# Patient Record
Sex: Female | Born: 1980 | Race: Black or African American | Hispanic: No | Marital: Married | Smoking: Never smoker
Health system: Southern US, Community
[De-identification: ages and names within clinical notes are randomized; demographics above are authoritative.]

## PROBLEM LIST (undated history)

## (undated) ENCOUNTER — Inpatient Hospital Stay (HOSPITAL_COMMUNITY): Payer: Self-pay

---

## 2014-04-26 ENCOUNTER — Inpatient Hospital Stay (HOSPITAL_COMMUNITY)
Admission: AD | Admit: 2014-04-26 | Discharge: 2014-04-26 | Disposition: A | Discharge status: Home / Self Care | Payer: Medicaid Other | Source: Ambulatory Visit | Attending: Obstetrics & Gynecology | Admitting: Obstetrics & Gynecology

## 2014-04-26 ENCOUNTER — Encounter (HOSPITAL_COMMUNITY): Payer: Self-pay | Admitting: Advanced Practice Midwife

## 2014-04-26 DIAGNOSIS — N910 Primary amenorrhea: Secondary | ICD-10-CM | POA: Insufficient documentation

## 2014-04-26 DIAGNOSIS — Z3201 Encounter for pregnancy test, result positive: Secondary | ICD-10-CM | POA: Diagnosis not present

## 2014-04-26 LAB — POCT PREGNANCY, URINE: PREG TEST UR: POSITIVE — AB

## 2014-04-26 NOTE — MAU Note (Addendum)
Here today to find out if she is preg.  Hx of irregular cycles, thinks her last was in Dec.  + HPT the end of Jan.  No pain, bleeding or d/c. occ itching

## 2014-04-26 NOTE — MAU Provider Note (Signed)
S: 34 y.o. G1P0 @[redacted]w[redacted]d  by unsure LMP presents to MAU for pregnancy confirmation.  She denies abdominal pain or vaginal bleeding today.  Pt declines language line for [place] interpreter, desires to use her s/o who is present today.  O: BP 131/75 mmHg  Pulse 83  Temp(Src) 97.6 F (36.4 C) (Oral)  Resp 18  Ht 5\' 3"  (1.6 m)  Wt 95.255 kg (210 lb)  BMI 37.21 kg/m2  LMP 01/17/2014 Physical Examination: General appearance - alert, well appearing, and in no distress, oriented to person, place, and time and acyanotic, in no respiratory distress  Results for orders placed or performed during the hospital encounter of 04/26/14 (from the past 48 hour(s))  Pregnancy, urine POC     Status: Abnormal   Collection Time: 04/26/14  1:48 PM  Result Value Ref Range   Preg Test, Ur POSITIVE (A) NEGATIVE    Comment:        THE SENSITIVITY OF THIS METHODOLOGY IS >24 mIU/mL    FHT likely present, but difficult by doppler, faintly and intermittently audible  A: Positive pregnancy test Late menses  P: D/C home Pregnancy verification letter given F/U with early prenatal care List of providers given Return to MAU as needed for emergencies  LEFTWICH-KIRBY, Delcia Spitzley, CNM 2:47 PM

## 2014-05-03 ENCOUNTER — Telehealth: Payer: Self-pay | Admitting: *Deleted

## 2014-05-03 NOTE — Telephone Encounter (Signed)
Patient is requesting a NOB appointment. Attempted to contact patient and left a message on voicemail for patient to contact the office.

## 2014-05-05 NOTE — Telephone Encounter (Signed)
Patient returned call 05-04-14 @ 4:51.  Attempted to return the patient's call and the number is no longer in service.

## 2014-05-25 ENCOUNTER — Other Ambulatory Visit (HOSPITAL_COMMUNITY): Payer: Self-pay | Admitting: Nurse Practitioner

## 2014-05-25 DIAGNOSIS — Z3689 Encounter for other specified antenatal screening: Secondary | ICD-10-CM

## 2014-05-25 LAB — OB RESULTS CONSOLE PLATELET COUNT: Platelets: 206 10*3/uL

## 2014-05-25 LAB — OB RESULTS CONSOLE ANTIBODY SCREEN: Antibody Screen: NEGATIVE

## 2014-05-25 LAB — CYTOLOGY - PAP: PAP SMEAR: NEGATIVE

## 2014-05-25 LAB — OB RESULTS CONSOLE RPR: RPR: NONREACTIVE

## 2014-05-25 LAB — SICKLE CELL SCREEN: Sickle Cell Screen: NORMAL

## 2014-05-25 LAB — OB RESULTS CONSOLE HGB/HCT, BLOOD
HCT: 36 %
Hemoglobin: 11.5 g/dL

## 2014-05-25 LAB — OB RESULTS CONSOLE ABO/RH: RH Type: POSITIVE

## 2014-05-25 LAB — OB RESULTS CONSOLE HEPATITIS B SURFACE ANTIGEN: HEP B S AG: NEGATIVE

## 2014-05-25 LAB — OB RESULTS CONSOLE VARICELLA ZOSTER ANTIBODY, IGG: VARICELLA IGG: IMMUNE

## 2014-05-25 LAB — GLUCOSE TOLERANCE, 1 HOUR (50G) W/O FASTING: GLUCOSE 1 HOUR GTT: 134 mg/dL (ref ?–200)

## 2014-05-25 LAB — CYSTIC FIBROSIS DIAGNOSTIC STUDY: INTERPRETATION-CFDNA: NEGATIVE

## 2014-05-25 LAB — OB RESULTS CONSOLE GC/CHLAMYDIA
CHLAMYDIA, DNA PROBE: NEGATIVE
Gonorrhea: NEGATIVE

## 2014-05-25 LAB — OB RESULTS CONSOLE RUBELLA ANTIBODY, IGM: Rubella: IMMUNE

## 2014-05-26 LAB — OB RESULTS CONSOLE HIV ANTIBODY (ROUTINE TESTING): HIV: NONREACTIVE

## 2014-05-31 ENCOUNTER — Other Ambulatory Visit (HOSPITAL_COMMUNITY): Payer: Self-pay | Admitting: Nurse Practitioner

## 2014-05-31 ENCOUNTER — Ambulatory Visit (HOSPITAL_COMMUNITY)
Admission: RE | Admit: 2014-05-31 | Discharge: 2014-05-31 | Disposition: A | Discharge status: Home / Self Care | Payer: Medicaid Other | Source: Ambulatory Visit | Attending: Nurse Practitioner | Admitting: Nurse Practitioner

## 2014-05-31 ENCOUNTER — Ambulatory Visit (HOSPITAL_COMMUNITY): Admission: RE | Admit: 2014-05-31 | Payer: Medicaid Other | Source: Ambulatory Visit

## 2014-05-31 DIAGNOSIS — Z3A26 26 weeks gestation of pregnancy: Secondary | ICD-10-CM | POA: Insufficient documentation

## 2014-05-31 DIAGNOSIS — O09529 Supervision of elderly multigravida, unspecified trimester: Secondary | ICD-10-CM | POA: Insufficient documentation

## 2014-05-31 DIAGNOSIS — O09522 Supervision of elderly multigravida, second trimester: Secondary | ICD-10-CM | POA: Diagnosis not present

## 2014-05-31 DIAGNOSIS — O09292 Supervision of pregnancy with other poor reproductive or obstetric history, second trimester: Secondary | ICD-10-CM | POA: Diagnosis not present

## 2014-05-31 DIAGNOSIS — Z315 Encounter for genetic counseling: Secondary | ICD-10-CM | POA: Insufficient documentation

## 2014-05-31 DIAGNOSIS — O3421 Maternal care for scar from previous cesarean delivery: Secondary | ICD-10-CM | POA: Diagnosis not present

## 2014-05-31 DIAGNOSIS — Z3689 Encounter for other specified antenatal screening: Secondary | ICD-10-CM

## 2014-05-31 NOTE — Progress Notes (Signed)
Appointment Date: 05/31/2014 DOB: 01/05/1981 Referring Provider: Alberteen Spindlearson, Ashley C, NP Attending: Dr. Eulis FosterKristen Porter  [name] and her husband, Lauren Porter, were seen for genetic counseling because of a maternal age of 34 at delivery.  [name] reports that she speaks [place], but understands [place].  Her husband acted as her interpreter, and she declined a medical interpreter.     They were counseled regarding maternal age and the association with risk for chromosome conditions due to nondisjunction.   We reviewed chromosomes, nondisjunction, and the associated 1 in 179 risk for fetal aneuploidy related to a maternal age of 34 at delivery.  They were counseled that the risk for aneuploidy decreases as gestational age increases, accounting for those pregnancies which spontaneously abort.  We specifically discussed Down syndrome (trisomy 2121), trisomies 5813 and 2318, and sex chromosome aneuploidies (47,XXX and 47,XXY) including the common features and prognoses of each.   We also reviewed the results of her Quad screen. They were counseled that screening tests are used to modify a patient's a priori risk for aneuploidy, typically based on age. We discussed that the Quad screen adjusts the age related chance for Down syndrome and Trisomy 5218.  Her Quad screen reduced the chance for each of these conditions in the pregnancy.  However, it also identified an elevated MSAFP of 2.79 MoM.  We discussed that this elevation of MSAFP is associated with a 1-2% chance for a fetal open neural tube defect.   We reviewed ONTDs, the typical multifactorial etiology, and variable prognosis.  In addition, we discussed additional explanations for an elevated MSAFP including: normal variation, twins, feto-maternal bleeding, a gestational dating error, abdominal wall defects, kidney differences, oligohydramnios, and placental problems.  We reviewed additional available screening options including noninvasive prenatal  screening (NIPS)/cell free DNA (cfDNA) testing and detailed ultrasound.    We reviewed the benefits and limitations of each option. Specifically, we discussed the conditions for which each test screens, the detection rates, and false positive rates of each. They were also counseled regarding diagnostic testing via amniocentesis. We reviewed the approximate 1 in 300-500 risk for complications for amniocentesis, including spontaneous pregnancy loss.   A complete ultrasound was performed today. The ultrasound report will be sent under separate cover. There were no visualized fetal anomalies or markers suggestive of aneuploidy.  Dating was changed, such that the Quad screen was drawn too late for interpretation.  This would explain the elevation of MSAFP, but it would also eliminate the normal screening results for Down syndrome and Trisomy 6018 and Lauren Porter would still have her age related chances.  Diagnostic testing was declined today.  They understand that ultrasound cannot rule out all birth defects or genetic syndromes. The patient was advised of this limitation and states she still does not want additional testing or screening at this time.   [name] was provided with written information regarding sickle cell anemia (SCA) including the carrier frequency and incidence in the African-American population, the availability of carrier testing and prenatal diagnosis if indicated.  In addition, we discussed that hemoglobinopathies are routinely screened for as part of the [place] newborn screening panel.  She declined hemoglobin electrophoresis today.  Both family histories were reviewed and found to be noncontributory for birth defects, intellectual disability, and known genetic conditions.  Their last pregnancy, a daughter, was an IUFD.  [name] and [place] there had sent her home to wait another week for the baby to turn,  as she was breech. This was at her due date. A week later  she returned to the doctors office, and while her baby was moving at that time, she was still breech.  She was sent home for another week and at some point during that week she noticed a lack of fetal movement.  At delivery, her daughter did not have any apparent anomalies.  Without further information regarding the provided family history, an accurate genetic risk cannot be calculated. Further genetic counseling is warranted if more information is obtained.  Mrs. Crutcher denied exposure to environmental toxins or chemical agents. She denied the use of alcohol, tobacco or street drugs. She denied significant viral illnesses during the course of her pregnancy. Her medical and surgical histories were noncontributory.   I counseled this couple regarding the above risks and available options.  The approximate face-to-face time with the genetic counselor was 45 minutes.  Mady Gemma, MS,  Certified Genetic Counselor

## 2014-06-08 ENCOUNTER — Encounter: Payer: Medicaid Other | Admitting: Obstetrics & Gynecology

## 2014-06-12 ENCOUNTER — Encounter: Payer: Self-pay | Admitting: *Deleted

## 2014-06-28 ENCOUNTER — Other Ambulatory Visit (HOSPITAL_COMMUNITY): Payer: Self-pay | Admitting: Maternal and Fetal Medicine

## 2014-06-28 ENCOUNTER — Ambulatory Visit (HOSPITAL_COMMUNITY)
Admission: RE | Admit: 2014-06-28 | Discharge: 2014-06-28 | Disposition: A | Discharge status: Home / Self Care | Payer: Medicaid Other | Source: Ambulatory Visit | Attending: Obstetrics and Gynecology | Admitting: Obstetrics and Gynecology

## 2014-06-28 ENCOUNTER — Encounter (HOSPITAL_COMMUNITY): Payer: Self-pay

## 2014-06-28 DIAGNOSIS — O3421 Maternal care for scar from previous cesarean delivery: Secondary | ICD-10-CM | POA: Insufficient documentation

## 2014-06-28 DIAGNOSIS — O09299 Supervision of pregnancy with other poor reproductive or obstetric history, unspecified trimester: Secondary | ICD-10-CM

## 2014-06-28 DIAGNOSIS — Z3A30 30 weeks gestation of pregnancy: Secondary | ICD-10-CM | POA: Diagnosis not present

## 2014-06-28 DIAGNOSIS — O09523 Supervision of elderly multigravida, third trimester: Secondary | ICD-10-CM | POA: Insufficient documentation

## 2014-06-28 DIAGNOSIS — O3413 Maternal care for benign tumor of corpus uteri, third trimester: Secondary | ICD-10-CM | POA: Insufficient documentation

## 2014-06-28 DIAGNOSIS — Z98891 History of uterine scar from previous surgery: Secondary | ICD-10-CM

## 2014-06-28 DIAGNOSIS — Z36 Encounter for antenatal screening of mother: Secondary | ICD-10-CM | POA: Insufficient documentation

## 2014-06-28 DIAGNOSIS — Z3689 Encounter for other specified antenatal screening: Secondary | ICD-10-CM

## 2014-06-28 DIAGNOSIS — O09293 Supervision of pregnancy with other poor reproductive or obstetric history, third trimester: Secondary | ICD-10-CM | POA: Insufficient documentation

## 2014-06-29 ENCOUNTER — Encounter: Payer: Self-pay | Admitting: Obstetrics and Gynecology

## 2014-06-29 ENCOUNTER — Ambulatory Visit (INDEPENDENT_AMBULATORY_CARE_PROVIDER_SITE_OTHER): Payer: Medicaid Other | Admitting: Obstetrics and Gynecology

## 2014-06-29 VITALS — BP 102/55 | HR 82 | Temp 97.5°F | Wt 221.3 lb

## 2014-06-29 DIAGNOSIS — O99213 Obesity complicating pregnancy, third trimester: Secondary | ICD-10-CM

## 2014-06-29 DIAGNOSIS — O0991 Supervision of high risk pregnancy, unspecified, first trimester: Secondary | ICD-10-CM | POA: Diagnosis not present

## 2014-06-29 DIAGNOSIS — O09299 Supervision of pregnancy with other poor reproductive or obstetric history, unspecified trimester: Secondary | ICD-10-CM | POA: Insufficient documentation

## 2014-06-29 DIAGNOSIS — O0933 Supervision of pregnancy with insufficient antenatal care, third trimester: Secondary | ICD-10-CM | POA: Diagnosis not present

## 2014-06-29 DIAGNOSIS — O3421 Maternal care for scar from previous cesarean delivery: Secondary | ICD-10-CM | POA: Diagnosis not present

## 2014-06-29 DIAGNOSIS — O09293 Supervision of pregnancy with other poor reproductive or obstetric history, third trimester: Secondary | ICD-10-CM

## 2014-06-29 DIAGNOSIS — O34219 Maternal care for unspecified type scar from previous cesarean delivery: Secondary | ICD-10-CM

## 2014-06-29 DIAGNOSIS — O09529 Supervision of elderly multigravida, unspecified trimester: Secondary | ICD-10-CM | POA: Insufficient documentation

## 2014-06-29 DIAGNOSIS — E669 Obesity, unspecified: Secondary | ICD-10-CM | POA: Diagnosis not present

## 2014-06-29 DIAGNOSIS — O99210 Obesity complicating pregnancy, unspecified trimester: Secondary | ICD-10-CM | POA: Insufficient documentation

## 2014-06-29 DIAGNOSIS — Z23 Encounter for immunization: Secondary | ICD-10-CM | POA: Diagnosis not present

## 2014-06-29 DIAGNOSIS — O09523 Supervision of elderly multigravida, third trimester: Secondary | ICD-10-CM

## 2014-06-29 DIAGNOSIS — O0990 Supervision of high risk pregnancy, unspecified, unspecified trimester: Secondary | ICD-10-CM | POA: Insufficient documentation

## 2014-06-29 LAB — POCT URINALYSIS DIP (DEVICE)
BILIRUBIN URINE: NEGATIVE
Glucose, UA: NEGATIVE mg/dL
Hgb urine dipstick: NEGATIVE
KETONES UR: NEGATIVE mg/dL
Leukocytes, UA: NEGATIVE
Nitrite: NEGATIVE
Protein, ur: NEGATIVE mg/dL
Specific Gravity, Urine: 1.015 (ref 1.005–1.030)
UROBILINOGEN UA: 0.2 mg/dL (ref 0.0–1.0)
pH: 6.5 (ref 5.0–8.0)

## 2014-06-29 MED ORDER — TETANUS-DIPHTH-ACELL PERTUSSIS 5-2.5-18.5 LF-MCG/0.5 IM SUSP
0.5000 mL | Freq: Once | INTRAMUSCULAR | Status: AC
  Administered 2014-06-29: 0.5 mL via INTRAMUSCULAR

## 2014-06-29 NOTE — Patient Instructions (Signed)
Contraception Choices Contraception (birth control) is the use of any methods or devices to prevent pregnancy. Below are some methods to help avoid pregnancy. HORMONAL METHODS   Contraceptive implant. This is a thin, plastic tube containing progesterone hormone. It does not contain estrogen hormone. Your health care provider inserts the tube in the inner part of the upper arm. The tube can remain in place for up to 3 years. After 3 years, the implant must be removed. The implant prevents the ovaries from releasing an egg (ovulation), thickens the cervical mucus to prevent sperm from entering the uterus, and thins the lining of the inside of the uterus.  Progesterone-only injections. These injections are given every 3 months by your health care provider to prevent pregnancy. This synthetic progesterone hormone stops the ovaries from releasing eggs. It also thickens cervical mucus and changes the uterine lining. This makes it harder for sperm to survive in the uterus.  Birth control pills. These pills contain estrogen and progesterone hormone. They work by preventing the ovaries from releasing eggs (ovulation). They also cause the cervical mucus to thicken, preventing the sperm from entering the uterus. Birth control pills are prescribed by a health care provider.Birth control pills can also be used to treat heavy periods.  Minipill. This type of birth control pill contains only the progesterone hormone. They are taken every day of each month and must be prescribed by your health care provider.  Birth control patch. The patch contains hormones similar to those in birth control pills. It must be changed once a week and is prescribed by a health care provider.  Vaginal ring. The ring contains hormones similar to those in birth control pills. It is left in the vagina for 3 weeks, removed for 1 week, and then a new one is put back in place. The patient must be comfortable inserting and removing the ring  from the vagina.A health care provider's prescription is necessary.  Emergency contraception. Emergency contraceptives prevent pregnancy after unprotected sexual intercourse. This pill can be taken right after sex or up to 5 days after unprotected sex. It is most effective the sooner you take the pills after having sexual intercourse. Most emergency contraceptive pills are available without a prescription. Check with your pharmacist. Do not use emergency contraception as your only form of birth control. BARRIER METHODS   Female condom. This is a thin sheath (latex or rubber) that is worn over the penis during sexual intercourse. It can be used with spermicide to increase effectiveness.  Female condom. This is a soft, loose-fitting sheath that is put into the vagina before sexual intercourse.  Diaphragm. This is a soft, latex, dome-shaped barrier that must be fitted by a health care provider. It is inserted into the vagina, along with a spermicidal jelly. It is inserted before intercourse. The diaphragm should be left in the vagina for 6 to 8 hours after intercourse.  Cervical cap. This is a round, soft, latex or plastic cup that fits over the cervix and must be fitted by a health care provider. The cap can be left in place for up to 48 hours after intercourse.  Sponge. This is a soft, circular piece of polyurethane foam. The sponge has spermicide in it. It is inserted into the vagina after wetting it and before sexual intercourse.  Spermicides. These are chemicals that kill or block sperm from entering the cervix and uterus. They come in the form of creams, jellies, suppositories, foam, or tablets. They do not require a   prescription. They are inserted into the vagina with an applicator before having sexual intercourse. The process must be repeated every time you have sexual intercourse. INTRAUTERINE CONTRACEPTION  Intrauterine device (IUD). This is a T-shaped device that is put in a woman's uterus  during a menstrual period to prevent pregnancy. There are 2 types:  Copper IUD. This type of IUD is wrapped in copper wire and is placed inside the uterus. Copper makes the uterus and fallopian tubes produce a fluid that kills sperm. It can stay in place for 10 years.  Hormone IUD. This type of IUD contains the hormone progestin (synthetic progesterone). The hormone thickens the cervical mucus and prevents sperm from entering the uterus, and it also thins the uterine lining to prevent implantation of a fertilized egg. The hormone can weaken or kill the sperm that get into the uterus. It can stay in place for 3-5 years, depending on which type of IUD is used. PERMANENT METHODS OF CONTRACEPTION  Female tubal ligation. This is when the woman's fallopian tubes are surgically sealed, tied, or blocked to prevent the egg from traveling to the uterus.  Hysteroscopic sterilization. This involves placing a small coil or insert into each fallopian tube. Your doctor uses a technique called hysteroscopy to do the procedure. The device causes scar tissue to form. This results in permanent blockage of the fallopian tubes, so the sperm cannot fertilize the egg. It takes about 3 months after the procedure for the tubes to become blocked. You must use another form of birth control for these 3 months.  Female sterilization. This is when the female has the tubes that carry sperm tied off (vasectomy).This blocks sperm from entering the vagina during sexual intercourse. After the procedure, the man can still ejaculate fluid (semen). NATURAL PLANNING METHODS  Natural family planning. This is not having sexual intercourse or using a barrier method (condom, diaphragm, cervical cap) on days the woman could become pregnant.  Calendar method. This is keeping track of the length of each menstrual cycle and identifying when you are fertile.  Ovulation method. This is avoiding sexual intercourse during ovulation.  Symptothermal  method. This is avoiding sexual intercourse during ovulation, using a thermometer and ovulation symptoms.  Post-ovulation method. This is timing sexual intercourse after you have ovulated. Regardless of which type or method of contraception you choose, it is important that you use condoms to protect against the transmission of sexually transmitted infections (STIs). Talk with your health care provider about which form of contraception is most appropriate for you. Document Released: 01/20/2005 Document Revised: 01/25/2013 Document Reviewed: 07/15/2012 ExitCare Patient Information 2015 [employer]. This information is not intended to replace advice given to you by your health care provider. Make sure you discuss any questions you have with your health care provider.  

## 2014-06-29 NOTE — Progress Notes (Signed)
[employer] interpreter [phone] used for this encounter.  Initial visit-- transfer from health department. Up to date on lab work.  Tdap today.

## 2014-06-29 NOTE — Progress Notes (Signed)
   Subjective:    Lauren Porter is a G3P2001 3938w5d being seen today for her first obstetrical visit.  Her obstetrical history is significant for advanced maternal age, obesity and previous cesarean section, and history of IUFD at 10038 weeks. Patient does intend to breast feed. Pregnancy history fully reviewed.  Patient reports no complaints.  Filed Vitals:   06/29/14 0903  BP: 102/55  Pulse: 82  Temp: 97.5 F (36.4 C)  Weight: 221 lb 4.8 oz (100.381 kg)    HISTORY: OB History  Gravida Para Term Preterm AB SAB TAB Ectopic Multiple Living  3 2 2  0 0 0    1    # Outcome Date GA Lbr Len/2nd Weight Sex Delivery Anes PTL Lv  3 Current           2 Term 04/2011 988w0d       FD  1 Term 06/10/05 5431w0d  9 lb 13 oz (4.451 kg) M CS-Unspec   Y     History reviewed. No pertinent past medical history. Past Surgical History  Procedure Laterality Date  . Cesarean section     Family History  Problem Relation Age of Onset  . Asthma Father      Exam    Not indicated    Assessment:    Pregnancy: G3P2001 Patient Active Problem List   Diagnosis Date Noted  . Supervision of high risk pregnancy in first trimester 06/29/2014  . Insufficient prenatal care in third trimester 06/29/2014  . Obesity affecting pregnancy, antepartum 06/29/2014  . Previous cesarean section complicating pregnancy, antepartum condition or complication 06/29/2014  . Prior pregnancy with fetal demise, antepartum 06/29/2014  . AMA (advanced maternal age) multigravida 35+ 06/29/2014        Plan:     Initial labs drawn. Prenatal vitamins. Problem list reviewed and updated. Genetic Screening discussed : declined.  Ultrasound discussed; fetal survey: results reviewed.  Follow up in 2 weeks for ob f/u and NST Patient with h/o c-section followed by VBAC of 38 week IUFD. Patient is uncertain as to wether she would like another TOLAC or a repeat cesarean section.  Discussed delivery at 39 weeks secondary to h/o  IUFD 50% of 30 min visit spent on counseling and coordination of care.     Lauren Porter 06/29/2014

## 2014-07-13 ENCOUNTER — Ambulatory Visit (INDEPENDENT_AMBULATORY_CARE_PROVIDER_SITE_OTHER): Payer: Medicaid Other | Admitting: Obstetrics & Gynecology

## 2014-07-13 VITALS — BP 121/53 | HR 100 | Wt 225.7 lb

## 2014-07-13 DIAGNOSIS — O09293 Supervision of pregnancy with other poor reproductive or obstetric history, third trimester: Secondary | ICD-10-CM | POA: Diagnosis present

## 2014-07-13 DIAGNOSIS — O0993 Supervision of high risk pregnancy, unspecified, third trimester: Secondary | ICD-10-CM | POA: Diagnosis not present

## 2014-07-13 DIAGNOSIS — O3421 Maternal care for scar from previous cesarean delivery: Secondary | ICD-10-CM

## 2014-07-13 DIAGNOSIS — Z789 Other specified health status: Secondary | ICD-10-CM | POA: Diagnosis not present

## 2014-07-13 DIAGNOSIS — O09523 Supervision of elderly multigravida, third trimester: Secondary | ICD-10-CM | POA: Diagnosis not present

## 2014-07-13 DIAGNOSIS — O34219 Maternal care for unspecified type scar from previous cesarean delivery: Secondary | ICD-10-CM

## 2014-07-13 NOTE — Progress Notes (Signed)
Subjective:   Lauren Porter is a 34 y.o. G3P2001 at [redacted]w[redacted]d being seen today for ongoing prenatal care. Patient is French-speaking only, [place] interpreter present for this encounter.  Patient reports no complaints.  Contractions: Not present.  Vag. Bleeding: None. Movement: Present. Denies leaking of fluid.   The following portions of the patient's history were reviewed and updated as appropriate: allergies, current medications, past family history, past medical history, past social history, past surgical history and problem list.   Objective:   Filed Vitals:   07/13/14 1039  BP: 121/53  Pulse: 100  Weight: 225 lb 11.2 oz (102.377 kg)    Fetal Status: Fetal Heart Rate (bpm): RNST Fundal Height: 34 cm Movement: Present     General:  Alert, oriented and cooperative. Patient is in no acute distress.  Skin: Skin is warm and dry. No rash noted.   Cardiovascular: Normal heart rate noted  Respiratory: Effort and breath sounds normal, no problems with respiration noted  Abdomen: Soft, gravid, appropriate for gestational age. Pain/Pressure: Absent     Vaginal: Vag. Bleeding: None.       Cervix: Deferred  Extremities: Normal range of motion.  Edema: Trace  Mental Status: Normal mood and affect. Normal behavior. Normal judgment and thought content.   Urinalysis: Urine Protein: Negative Urine Glucose: Negative   Assessment and Plan:   Pregnancy: G3P2001 at [redacted]w[redacted]d  1. Prior pregnancy with fetal demise, antepartum, third trimester NST performed today was reviewed and was found to be reactive.  Continue recommended antenatal testing and prenatal care.  Delivery at 39 weeks  2. Previous cesarean section complicating pregnancy, antepartum condition or complication Patient thinks she desires TOLAC, risks reviewed in detail. Consent given to patient to review at home, she does not want to sign until next visit  3. AMA (advanced maternal age) multigravida 35+, third trimester No testing  indicated   4. Language barrier, speaks [place] Interpreter present during entire encounter  Preterm labor symptoms and general obstetric precautions including but not limited to vaginal bleeding, contractions, leaking of fluid and fetal movement were reviewed in detail with the patient. Please refer to After Visit Summary for other counseling recommendations.   Follow up as scheduled for antenatal testing and OB visits.  Tereso Newcomer, MD

## 2014-07-13 NOTE — Patient Instructions (Signed)
Return to clinic for any obstetric concerns or go to MAU for evaluation  

## 2014-07-14 LAB — POCT URINALYSIS DIP (DEVICE)
BILIRUBIN URINE: NEGATIVE
Glucose, UA: NEGATIVE mg/dL
Ketones, ur: NEGATIVE mg/dL
LEUKOCYTES UA: NEGATIVE
Nitrite: NEGATIVE
PROTEIN: NEGATIVE mg/dL
Specific Gravity, Urine: 1.015 (ref 1.005–1.030)
Urobilinogen, UA: 0.2 mg/dL (ref 0.0–1.0)
pH: 7.5 (ref 5.0–8.0)

## 2014-07-18 ENCOUNTER — Ambulatory Visit (INDEPENDENT_AMBULATORY_CARE_PROVIDER_SITE_OTHER): Payer: Medicaid Other | Admitting: *Deleted

## 2014-07-18 VITALS — BP 106/49 | HR 92

## 2014-07-18 DIAGNOSIS — O09293 Supervision of pregnancy with other poor reproductive or obstetric history, third trimester: Secondary | ICD-10-CM

## 2014-07-18 NOTE — Progress Notes (Signed)
Interpreter Burna Forts present for encounter.  [place] for growth & BPP on 6/21

## 2014-07-18 NOTE — Progress Notes (Signed)
NSt reactive 

## 2014-07-20 ENCOUNTER — Encounter: Payer: Self-pay | Admitting: Obstetrics and Gynecology

## 2014-07-20 ENCOUNTER — Ambulatory Visit (INDEPENDENT_AMBULATORY_CARE_PROVIDER_SITE_OTHER): Payer: Medicaid Other | Admitting: Obstetrics and Gynecology

## 2014-07-20 VITALS — BP 105/46 | HR 87 | Wt 227.7 lb

## 2014-07-20 DIAGNOSIS — O3421 Maternal care for scar from previous cesarean delivery: Secondary | ICD-10-CM

## 2014-07-20 DIAGNOSIS — Z789 Other specified health status: Secondary | ICD-10-CM | POA: Diagnosis not present

## 2014-07-20 DIAGNOSIS — O09523 Supervision of elderly multigravida, third trimester: Secondary | ICD-10-CM

## 2014-07-20 DIAGNOSIS — O0933 Supervision of pregnancy with insufficient antenatal care, third trimester: Secondary | ICD-10-CM

## 2014-07-20 DIAGNOSIS — O09293 Supervision of pregnancy with other poor reproductive or obstetric history, third trimester: Secondary | ICD-10-CM

## 2014-07-20 DIAGNOSIS — O34219 Maternal care for unspecified type scar from previous cesarean delivery: Secondary | ICD-10-CM

## 2014-07-20 LAB — POCT URINALYSIS DIP (DEVICE)
Bilirubin Urine: NEGATIVE
Glucose, UA: NEGATIVE mg/dL
HGB URINE DIPSTICK: NEGATIVE
Ketones, ur: NEGATIVE mg/dL
Nitrite: NEGATIVE
PROTEIN: NEGATIVE mg/dL
Specific Gravity, Urine: 1.015 (ref 1.005–1.030)
Urobilinogen, UA: 0.2 mg/dL (ref 0.0–1.0)
pH: 7.5 (ref 5.0–8.0)

## 2014-07-20 NOTE — Progress Notes (Signed)
[place] for growth & BPP on 6/21.  Pt desires TOLAC - needs to sign VBAC form.

## 2014-07-20 NOTE — Progress Notes (Signed)
Subjective:  Rayla Piechota Millstein is a 34 y.o. G3P2001 at [redacted]w[redacted]d being seen today for ongoing prenatal care.  Patient reports no complaints.  Contractions: Not present.  Vag. Bleeding: None. Movement: Present. Denies leaking of fluid.   The following portions of the patient's history were reviewed and updated as appropriate: allergies, current medications, past family history, past medical history, past social history, past surgical history and problem list.   Objective:   Filed Vitals:   07/20/14 1037  BP: 105/46  Pulse: 87  Weight: 227 lb 11.2 oz (103.284 kg)    Fetal Status: Fetal Heart Rate (bpm): NST   Movement: Present     General:  Alert, oriented and cooperative. Patient is in no acute distress.  Skin: Skin is warm and dry. No rash noted.   Cardiovascular: Normal heart rate noted  Respiratory: Effort and breath sounds normal, no problems with respiration noted  Abdomen: Soft, gravid, appropriate for gestational age. Pain/Pressure: Absent     Vaginal: Vag. Bleeding: None.       Cervix: Not evaluated  Extremities: Normal range of motion.     Mental Status: Normal mood and affect. Normal behavior. Normal judgment and thought content.   Urinalysis: Urine Protein: Negative Urine Glucose: Negative  Assessment and Plan:  Pregnancy: G3P2001 at [redacted]w[redacted]d  1. Prior pregnancy with fetal demise, antepartum, third trimester Patient is doing well - Fetal nonstress test reviewed and reactive  2. Previous cesarean section complicating pregnancy, antepartum condition or complication Patient desires TOLAC and consent signed today  3. Language barrier, speaks [place]   4. Insufficient prenatal care in third trimester   5. AMA (advanced maternal age) multigravida 35+, third trimester    Preterm labor symptoms and general obstetric precautions including but not limited to vaginal bleeding, contractions, leaking of fluid and fetal movement were reviewed in detail with the patient.  Please  refer to After Visit Summary for other counseling recommendations.   Return in about 1 week (around 07/27/2014).   Catalina Antigua, MD

## 2014-07-25 ENCOUNTER — Encounter (HOSPITAL_COMMUNITY): Payer: Self-pay

## 2014-07-25 ENCOUNTER — Ambulatory Visit (HOSPITAL_COMMUNITY)
Admission: RE | Admit: 2014-07-25 | Discharge: 2014-07-25 | Disposition: A | Discharge status: Home / Self Care | Payer: Medicaid Other | Source: Ambulatory Visit | Attending: Nurse Practitioner | Admitting: Nurse Practitioner

## 2014-07-25 DIAGNOSIS — O09523 Supervision of elderly multigravida, third trimester: Secondary | ICD-10-CM | POA: Insufficient documentation

## 2014-07-25 DIAGNOSIS — Z98891 History of uterine scar from previous surgery: Secondary | ICD-10-CM

## 2014-07-25 DIAGNOSIS — O09299 Supervision of pregnancy with other poor reproductive or obstetric history, unspecified trimester: Secondary | ICD-10-CM

## 2014-07-25 DIAGNOSIS — O09293 Supervision of pregnancy with other poor reproductive or obstetric history, third trimester: Secondary | ICD-10-CM | POA: Insufficient documentation

## 2014-07-25 DIAGNOSIS — O3421 Maternal care for scar from previous cesarean delivery: Secondary | ICD-10-CM | POA: Insufficient documentation

## 2014-07-25 DIAGNOSIS — Z3A34 34 weeks gestation of pregnancy: Secondary | ICD-10-CM | POA: Insufficient documentation

## 2014-07-27 ENCOUNTER — Encounter: Payer: Self-pay | Admitting: Obstetrics and Gynecology

## 2014-07-27 ENCOUNTER — Ambulatory Visit (INDEPENDENT_AMBULATORY_CARE_PROVIDER_SITE_OTHER): Payer: Medicaid Other | Admitting: Obstetrics and Gynecology

## 2014-07-27 VITALS — BP 116/49 | HR 84 | Wt 229.0 lb

## 2014-07-27 DIAGNOSIS — O09293 Supervision of pregnancy with other poor reproductive or obstetric history, third trimester: Secondary | ICD-10-CM | POA: Diagnosis not present

## 2014-07-27 DIAGNOSIS — O3421 Maternal care for scar from previous cesarean delivery: Secondary | ICD-10-CM

## 2014-07-27 DIAGNOSIS — O0993 Supervision of high risk pregnancy, unspecified, third trimester: Secondary | ICD-10-CM

## 2014-07-27 DIAGNOSIS — O09523 Supervision of elderly multigravida, third trimester: Secondary | ICD-10-CM | POA: Diagnosis not present

## 2014-07-27 DIAGNOSIS — O34219 Maternal care for unspecified type scar from previous cesarean delivery: Secondary | ICD-10-CM

## 2014-07-27 DIAGNOSIS — O0933 Supervision of pregnancy with insufficient antenatal care, third trimester: Secondary | ICD-10-CM

## 2014-07-27 LAB — POCT URINALYSIS DIP (DEVICE)
BILIRUBIN URINE: NEGATIVE
Glucose, UA: NEGATIVE mg/dL
Hgb urine dipstick: NEGATIVE
Ketones, ur: NEGATIVE mg/dL
NITRITE: NEGATIVE
PH: 6.5 (ref 5.0–8.0)
Protein, ur: NEGATIVE mg/dL
Specific Gravity, Urine: 1.01 (ref 1.005–1.030)
UROBILINOGEN UA: 0.2 mg/dL (ref 0.0–1.0)

## 2014-07-27 NOTE — Progress Notes (Signed)
Subjective:  Lauren Porter is a 34 y.o. G3P2001 at [redacted]w[redacted]d being seen today for ongoing prenatal care.  Patient reports no complaints.  Contractions: Not present.  Vag. Bleeding: None. Movement: Present. Denies leaking of fluid.   The following portions of the patient's history were reviewed and updated as appropriate: allergies, current medications, past family history, past medical history, past social history, past surgical history and problem list.   Objective:   Filed Vitals:   07/27/14 0937  BP: 116/49  Pulse: 84  Weight: 229 lb (103.874 kg)    Fetal Status: Fetal Heart Rate (bpm): NST   Movement: Present     General:  Alert, oriented and cooperative. Patient is in no acute distress.  Skin: Skin is warm and dry. No rash noted.   Cardiovascular: Normal heart rate noted  Respiratory: Effort and breath sounds normal, no problems with respiration noted  Abdomen: Soft, gravid, appropriate for gestational age. Pain/Pressure: Absent     Vaginal: Vag. Bleeding: None.       Cervix: Not evaluated        Extremities: Normal range of motion.  Edema: Trace  Mental Status: Normal mood and affect. Normal behavior. Normal judgment and thought content.   Urinalysis: Urine Protein: Negative Urine Glucose: Negative  Assessment and Plan:  Pregnancy: G3P2001 at [redacted]w[redacted]d  1. History of intrauterine fetal death, currently pregnant, third trimester NST reviewed and reactive - Fetal nonstress test  2. Previous cesarean section complicating pregnancy, antepartum condition or complication Still remains undecided. Informed patient that repeat c-section needs to be scheduled  3. Prior pregnancy with fetal demise, antepartum, third trimester Continue twice weekly fetal testing  4. Supervision of high risk pregnancy, antepartum, third trimester   5. AMA (advanced maternal age) multigravida 35+, third trimester   6. Insufficient prenatal care in third trimester    Preterm labor symptoms and  general obstetric precautions including but not limited to vaginal bleeding, contractions, leaking of fluid and fetal movement were reviewed in detail with the patient.  Please refer to After Visit Summary for other counseling recommendations.   Return in about 1 day (around 07/28/2014).   Catalina Antigua, MD

## 2014-08-01 ENCOUNTER — Ambulatory Visit (INDEPENDENT_AMBULATORY_CARE_PROVIDER_SITE_OTHER): Payer: Medicaid Other | Admitting: *Deleted

## 2014-08-01 ENCOUNTER — Encounter: Payer: Self-pay | Admitting: *Deleted

## 2014-08-01 VITALS — BP 103/53 | HR 90

## 2014-08-01 DIAGNOSIS — O09293 Supervision of pregnancy with other poor reproductive or obstetric history, third trimester: Secondary | ICD-10-CM

## 2014-08-01 NOTE — Progress Notes (Signed)
Interpreter Hadizatou Sofo present for encounter.

## 2014-08-01 NOTE — Progress Notes (Signed)
NST reactive.

## 2014-08-03 ENCOUNTER — Other Ambulatory Visit: Payer: Self-pay | Admitting: Family

## 2014-08-03 ENCOUNTER — Ambulatory Visit (INDEPENDENT_AMBULATORY_CARE_PROVIDER_SITE_OTHER): Payer: Medicaid Other | Admitting: Family

## 2014-08-03 VITALS — BP 105/45 | HR 84

## 2014-08-03 DIAGNOSIS — O3421 Maternal care for scar from previous cesarean delivery: Secondary | ICD-10-CM

## 2014-08-03 DIAGNOSIS — O09293 Supervision of pregnancy with other poor reproductive or obstetric history, third trimester: Secondary | ICD-10-CM

## 2014-08-03 DIAGNOSIS — O34219 Maternal care for unspecified type scar from previous cesarean delivery: Secondary | ICD-10-CM

## 2014-08-03 LAB — POCT URINALYSIS DIP (DEVICE)
Bilirubin Urine: NEGATIVE
GLUCOSE, UA: NEGATIVE mg/dL
Hgb urine dipstick: NEGATIVE
KETONES UR: NEGATIVE mg/dL
Leukocytes, UA: NEGATIVE
NITRITE: NEGATIVE
PH: 5.5 (ref 5.0–8.0)
Protein, ur: NEGATIVE mg/dL
SPECIFIC GRAVITY, URINE: 1.01 (ref 1.005–1.030)
Urobilinogen, UA: 0.2 mg/dL (ref 0.0–1.0)

## 2014-08-03 LAB — OB RESULTS CONSOLE GBS: STREP GROUP B AG: POSITIVE

## 2014-08-03 NOTE — Progress Notes (Signed)
Subjective:  Lauren Porter is a 34 y.o. G3P2001 at 5271w5d being seen today for ongoing prenatal care.  Patient reports no complaints.  Contractions: Irregular.  Vag. Bleeding: None. Movement: Present. Denies leaking of fluid.   The following portions of the patient's history were reviewed and updated as appropriate: allergies, current medications, past family history, past medical history, past social history, past surgical history and problem list.   Objective:   Filed Vitals:   08/03/14 0748  BP: 105/45  Pulse: 84    Fetal Status:     Movement: Present     General:  Alert, oriented and cooperative. Patient is in no acute distress.  Skin: Skin is warm and dry. No rash noted.   Cardiovascular: Normal heart rate noted  Respiratory: Normal respiratory effort, no problems with respiration noted  Abdomen: Soft, gravid, appropriate for gestational age. Pain/Pressure: Present     Vaginal: Vag. Bleeding: None.       Cervix: Exam revealed      FT/thick  Extremities: Normal range of motion.  Edema: Mild pitting, slight indentation  Mental Status: Normal mood and affect. Normal behavior. Normal judgment and thought content.   Urinalysis: Urine Protein: Negative Urine Glucose: Negative  Assessment and Plan:  Pregnancy: G3P2001 at 6271w5d  1. Prior pregnancy with fetal demise, antepartum, third trimester - Fetal nonstress test > reactive - Culture, beta strep (group b only) - GC/Chlamydia Probe Amp   Preterm labor symptoms and general obstetric precautions including but not limited to vaginal bleeding, contractions, leaking of fluid and fetal movement were reviewed in detail with the patient.  Please refer to After Visit Summary for other counseling recommendations.   Return for as scheduled.   Eino FarberWalidah Kennith GainN Karim, CNM

## 2014-08-03 NOTE — Progress Notes (Signed)
Interpreter Areatha KeasMarie Ferron present for encounter.

## 2014-08-04 LAB — GC/CHLAMYDIA PROBE AMP
CT PROBE, AMP APTIMA: NEGATIVE
GC Probe RNA: NEGATIVE

## 2014-08-04 LAB — CULTURE, BETA STREP (GROUP B ONLY)

## 2014-08-08 ENCOUNTER — Ambulatory Visit (HOSPITAL_COMMUNITY)
Admission: RE | Admit: 2014-08-08 | Discharge: 2014-08-08 | Disposition: A | Discharge status: Home / Self Care | Payer: Medicaid Other | Source: Ambulatory Visit | Attending: Family Medicine | Admitting: Family Medicine

## 2014-08-08 ENCOUNTER — Encounter (HOSPITAL_COMMUNITY): Payer: Self-pay

## 2014-08-08 DIAGNOSIS — Z3A36 36 weeks gestation of pregnancy: Secondary | ICD-10-CM | POA: Insufficient documentation

## 2014-08-08 DIAGNOSIS — O09293 Supervision of pregnancy with other poor reproductive or obstetric history, third trimester: Secondary | ICD-10-CM | POA: Insufficient documentation

## 2014-08-08 DIAGNOSIS — O09523 Supervision of elderly multigravida, third trimester: Secondary | ICD-10-CM | POA: Diagnosis present

## 2014-08-08 DIAGNOSIS — O3421 Maternal care for scar from previous cesarean delivery: Secondary | ICD-10-CM | POA: Diagnosis not present

## 2014-08-10 ENCOUNTER — Ambulatory Visit (INDEPENDENT_AMBULATORY_CARE_PROVIDER_SITE_OTHER): Payer: Medicaid Other | Admitting: Obstetrics and Gynecology

## 2014-08-10 ENCOUNTER — Encounter: Payer: Self-pay | Admitting: Obstetrics and Gynecology

## 2014-08-10 VITALS — BP 122/56 | HR 82 | Wt 232.0 lb

## 2014-08-10 DIAGNOSIS — O09523 Supervision of elderly multigravida, third trimester: Secondary | ICD-10-CM | POA: Diagnosis not present

## 2014-08-10 DIAGNOSIS — O0933 Supervision of pregnancy with insufficient antenatal care, third trimester: Secondary | ICD-10-CM

## 2014-08-10 DIAGNOSIS — E669 Obesity, unspecified: Secondary | ICD-10-CM | POA: Diagnosis not present

## 2014-08-10 DIAGNOSIS — O09293 Supervision of pregnancy with other poor reproductive or obstetric history, third trimester: Secondary | ICD-10-CM

## 2014-08-10 DIAGNOSIS — O0993 Supervision of high risk pregnancy, unspecified, third trimester: Secondary | ICD-10-CM

## 2014-08-10 DIAGNOSIS — O34219 Maternal care for unspecified type scar from previous cesarean delivery: Secondary | ICD-10-CM

## 2014-08-10 DIAGNOSIS — O3421 Maternal care for scar from previous cesarean delivery: Secondary | ICD-10-CM | POA: Diagnosis not present

## 2014-08-10 DIAGNOSIS — O99213 Obesity complicating pregnancy, third trimester: Secondary | ICD-10-CM | POA: Diagnosis not present

## 2014-08-10 DIAGNOSIS — Z789 Other specified health status: Secondary | ICD-10-CM | POA: Diagnosis not present

## 2014-08-10 LAB — POCT URINALYSIS DIP (DEVICE)
BILIRUBIN URINE: NEGATIVE
GLUCOSE, UA: NEGATIVE mg/dL
HGB URINE DIPSTICK: NEGATIVE
Ketones, ur: NEGATIVE mg/dL
NITRITE: NEGATIVE
Protein, ur: NEGATIVE mg/dL
SPECIFIC GRAVITY, URINE: 1.02 (ref 1.005–1.030)
Urobilinogen, UA: 0.2 mg/dL (ref 0.0–1.0)
pH: 7 (ref 5.0–8.0)

## 2014-08-10 NOTE — Progress Notes (Signed)
Subjective:  Lauren Porter is a 34 y.o. G3P2001 at 301w5d being seen today for ongoing prenatal care.  Patient reports no complaints.  Contractions: Irregular.  Vag. Bleeding: None. Movement: Present. Denies leaking of fluid.   The following portions of the patient's history were reviewed and updated as appropriate: allergies, current medications, past family history, past medical history, past social history, past surgical history and problem list.   Objective:   Filed Vitals:   08/10/14 0933  BP: 122/56  Pulse: 82  Weight: 232 lb (105.235 kg)    Fetal Status: Fetal Heart Rate (bpm): 141   Movement: Present     General:  Alert, oriented and cooperative. Patient is in no acute distress.  Skin: Skin is warm and dry. No rash noted.   Cardiovascular: Normal heart rate noted  Respiratory: Normal respiratory effort, no problems with respiration noted  Abdomen: Soft, gravid, appropriate for gestational age. Pain/Pressure: Present     Vaginal: Vag. Bleeding: None.       Cervix: Not evaluated        Extremities: Normal range of motion.  Edema: Deep pitting, indentation remains for a short time  Mental Status: Normal mood and affect. Normal behavior. Normal judgment and thought content.   Urinalysis: Urine Protein: Negative Urine Glucose: Negative  Assessment and Plan:  Pregnancy: G3P2001 at 591w5d  1. AMA (advanced maternal age) multigravida 35+, third trimester Declined genetic testing  2. Insufficient prenatal care in third trimester   3. Language barrier, speaks [place]   4. Obesity affecting pregnancy, antepartum, third trimester   5. Previous cesarean section complicating pregnancy, antepartum condition or complication Desires to TOLAC  6. Prior pregnancy with fetal demise, antepartum, third trimester BPP 8/8 on 7/5 Continue twice weekly testing Plan for IOL at 39 week  7. Supervision of high risk pregnancy, antepartum, third trimester    Term labor symptoms and  general obstetric precautions including but not limited to vaginal bleeding, contractions, leaking of fluid and fetal movement were reviewed in detail with the patient.  Please refer to After Visit Summary for other counseling recommendations.   Return in about 1 week (around 08/17/2014).   Catalina AntiguaPeggy Davaris Youtsey, MD

## 2014-08-10 NOTE — Progress Notes (Signed)
Mod leuks on UA  Reviewed tip of week with patient  Lauren Porter used for interpreter

## 2014-08-15 ENCOUNTER — Ambulatory Visit (INDEPENDENT_AMBULATORY_CARE_PROVIDER_SITE_OTHER): Payer: Medicaid Other | Admitting: *Deleted

## 2014-08-15 VITALS — BP 103/53 | HR 88

## 2014-08-15 DIAGNOSIS — O09293 Supervision of pregnancy with other poor reproductive or obstetric history, third trimester: Secondary | ICD-10-CM

## 2014-08-15 NOTE — Progress Notes (Signed)
Interpreter Burna FortsAline Ruhashya present for encounter.   US for growth scheduled 7/19.  IOL scheduled 7/23 @ midnight - pt to arrive on 7/22 @ 11:45 PM. Dr. Jolayne Pantheronstant notified of fetal position (oblique) - expectant management @ this time and will re-evaluate @ next visit on 7/14.

## 2014-08-15 NOTE — Progress Notes (Signed)
08/15/14 NST reviewed and reactive

## 2014-08-17 ENCOUNTER — Ambulatory Visit (INDEPENDENT_AMBULATORY_CARE_PROVIDER_SITE_OTHER): Payer: Medicaid Other | Admitting: Family Medicine

## 2014-08-17 VITALS — BP 101/57 | HR 80 | Wt 232.0 lb

## 2014-08-17 DIAGNOSIS — O34219 Maternal care for unspecified type scar from previous cesarean delivery: Secondary | ICD-10-CM

## 2014-08-17 DIAGNOSIS — O09293 Supervision of pregnancy with other poor reproductive or obstetric history, third trimester: Secondary | ICD-10-CM | POA: Diagnosis present

## 2014-08-17 DIAGNOSIS — O99820 Streptococcus B carrier state complicating pregnancy: Secondary | ICD-10-CM | POA: Insufficient documentation

## 2014-08-17 DIAGNOSIS — Z22330 Carrier of Group B streptococcus: Secondary | ICD-10-CM

## 2014-08-17 DIAGNOSIS — O3421 Maternal care for scar from previous cesarean delivery: Secondary | ICD-10-CM

## 2014-08-17 LAB — POCT URINALYSIS DIP (DEVICE)
BILIRUBIN URINE: NEGATIVE
Glucose, UA: NEGATIVE mg/dL
Ketones, ur: NEGATIVE mg/dL
NITRITE: NEGATIVE
Protein, ur: NEGATIVE mg/dL
SPECIFIC GRAVITY, URINE: 1.015 (ref 1.005–1.030)
UROBILINOGEN UA: 0.2 mg/dL (ref 0.0–1.0)
pH: 7 (ref 5.0–8.0)

## 2014-08-17 NOTE — Progress Notes (Signed)
Interpreter Lauren KeasMarie Porter present for encounter. US for growth scheduled 7/19.  IOL on 7/22 @ 2345

## 2014-08-17 NOTE — Patient Instructions (Addendum)
Allaitement maternel Dcider d'allaiter est l'un des [employer] que vous pouvez faire pour vous et votre bb. Un changement dans les hormones pendant la grossesse provoque votre tissu mammaire  crotre et  augmenter le nombre et la taille de vos conduits de lait. Ces hormones permettent galement des protines, des sucres et des graisses de votre alimentation en sang pour [employer] lait maternel dans vos glandes productrices de lait. Hormones empchent le lait maternel d'tre libr avant que votre bb est n, ainsi que le dbit de lait rapide aprs la naissance. Une fois que l'allaitement a commenc, les penses de votre bb, ainsi que son aspiration ou pleurer, peut stimuler la libration de lait de vos glandes productrices de lait. AVANTAGES DE L'ALLAITEMENT MATERNEL Pour votre bb Votre premier lait (colostrum) aide  la fonction du systme digestif de votre bb mieux. Il y a des [employer] lait qui aident votre bb  combattre les infections. Votre bb a une plus faible incidence de l'asthme, les allergies, et le syndrome de mort subite du nourrisson. Les nutriments contenus Secondary school teacher lait maternel sont mieux pour votre bb que les prparations pour nourrissons et sont conus uniquement pour les besoins de votre bb. Le lait maternel amliore le dveloppement du cerveau de votre bb. Votre bb est moins susceptible de dvelopper d'autres conditions, telles que l'obsit infantile, l'asthme ou le diabte sucr de type 2. Pour toi L'allaitement maternel contribue  crer un lien trs spcial entre vous et votre bb. L'allaitement maternel est pratique. Le lait maternel est toujours disponible  la bonne temprature et ne cote rien. L'allaitement maternel aide  brler des calories et vous aide  perdre le poids pris pendant la grossesse. L'allaitement rend votre contrat de l'utrus  sa taille d'avant la grossesse plus rapide et ralentit le saignement (lochies) aprs avoir  donn naissance. L'allaitement maternel aide  rduire votre risque de dvelopper le diabte sucr de type 2, l'ostoporose et cancer du sein ou de l'ovaire plus tard [employer]. SIGNES QUE VOTRE BB EST FAIM Les premiers signes de la faim la vigilance ou [place] activit accrue. tirage. Mouvement de la tte de gauche  droite. Mouvement de la tte et l'ouverture de la bouche lorsque le coin de la bouche ou de la joue est caressa (enracinement). Augmentation de sucer des sons, faisant claquer les lvres, roucoulant, soupirant ou grincement. Main--bouche mouvements. Augmentation de la succion des [employer] mains. Tardives Les signes de la faim Fussing. pleurer intermittent. Extreme Les signes de la faim Les signes de la faim extrme exigeront apaisante et consolante avant que votre bb sera en mesure d'allaiter avec succs. Ne pas attendre que les signes de la faim extrme suivantes  se produire avant de lancer l'allaitement: Agitation. A, forte cri. En hurlant. BASICS ALLAITEMENT L'allaitement maternel Initiation Trouvez un endroit confortable pour se reposer ou se Economist, Technical brewer cou et [employer] Inc. Placez un oreiller ou une couverture roule sous votre bb pour [employer] apporter au niveau de votre poitrine (si vous tes assis). Coussin d'allaitement sont spcialement conus pour aider  soutenir vos bras et votre bb pendant que vous allaitez. Assurez-vous que l'abdomen de votre bb fait face  l'abdomen. Massez doucement votre sein. Avec vos doigts, massage de votre paroi thoracique vers votre mamelon dans un mouvement circulaire. Cela encourage l'coulement du lait. Vous devrez peut-tre poursuivre cette action lors de l'alimentation si votre lait coule lentement. Soutenez Insurance underwriter 4 doigts en dessous et le pouce au-dessus de  votre mamelon. Assurez-vous que vos doigts sont bien loin de votre mamelon et la bouche de votre bb. Stroke les lvres de votre bb  doucement avec votre doigt ou du mamelon. Lorsque la bouche de votre bb est assez grand ouvert, mettre rapidement votre bb au sein, en plaant l'ensemble de votre mamelon et autant de la zone [employer] autour de votre mamelon (arole) que possible dans la bouche de votre bb. Plus arole doit tre visible au-dessus de la lvre suprieure de votre bb que sous la lvre infrieure. la langue de votre bb devrait tre entre sa gencive infrieure et votre poitrine. Assurez-vous que la bouche de votre bb est correctement positionn autour de votre mamelon (verrouill). Les lvres de votre bb devraient crer un sceau sur ton sein et tre tourn (version). Il est courant pour votre bb  sucer environ 2-3 minutes afin de Engineer, technical sales. accrochage Enseigner  votre bb comment verrou sur votre sein correctement est trs important. Un verrou incorrect peut provoquer des Manpower Inc mamelon et une diminution de la production de lait pour vous et faible gain de [employer] votre bb. En outre, si votre bb est pas verrouill sur Development worker, international aid, il ou elle peut avaler un peu d'air lors de l'alimentation. Cela peut rendre votre bb difficile. Roter votre bb lorsque vous changez de seins Human resources officer  se dbarrasser de Brewing technologist. Cependant, l'enseignement de votre bb  accrocher correctement est toujours la meilleure faon de prvenir l'irritabilit d'avaler l'air pendant l'allaitement. Les [employer] votre bb a verrouille avec succs sur votre mamelon: tiraillement silencieuse ou Artist silence, sans vous causer de Armed forces logistics/support/administrative officer. Dglutition entendu entre tous les 3-4 suce. mouvement musculaire au-dessus et en face de ses oreilles tout en suant. Les signes que votre bb n'a pas verrouille avec succs sur le mamelon: Sucer sons ou claquer des sons de votre bb Chief Financial Officer. douleur Nipple. Si vous pensez que votre bb n'a pas  Tourist information centre manager, Sales executive coin de la bouche de votre bb pour briser la succion et [employer] gencives de votre bb. Tobi Bastos  nouveau initiation  l'allaitement. Les signes de succs de l'allaitement Les signes de votre bb: Une diminution progressive du nombre de suce ou de la cessation complte de la succion. S'endormir. Relaxation de son corps. Maintien d'une petite quantit de lait dans sa bouche. Lcher de votre poitrine par lui-mme ou elle-mme. Signes de vous: Les seins [employer] Inc augment la fermet, le poids et la taille 1-3 heures aprs le repas. Les seins Automatic Data plus doux Counselling psychologist. le volume de lait accrue, ainsi qu'une modification de la [employer] lait et de Educational psychologist par le cinquime jour de Statistician. Nipples qui ne sont pas mal, fissures, ou des saignements. Les signes que votre bb reoit assez de Estate manager/land agent au moins 3 couches dans une priode de 24 heures. L'urine doit tre clair et jaune ple par ge 5 jours. Au moins 3 selles dans une priode de 24 heures par l'ge de 5 jours. Le tabouret doit tre souple et jaune. Au moins 3 selles dans une priode de 24 heures par l'ge de 7 jours. Le tabouret doit tre minable et jaune. Pas de perte de poids suprieure  10% du poids de naissance [employer] 3 premiers jours d'ge. Gain de poids moyen de 4-7 onces (113-198 g) par semaine aprs l'ge de 4 jours. Gain de poids quotidien Conformment par l'ge de  5 jours, sans perte de poids aprs l'ge de 2 semaines. Aprs une alimentation, votre bb peut cracher une petite quantit. Cette situation est commune. ALLAITEMENT FRQUENCE ET DURE Une alimentation frquente vous aidera  faire plus de lait et peut prvenir les mamelons douloureux et l'engorgement des seins. Allaiter quand vous vous sentez la ncessit de rduire la plnitude de vos seins ou lorsque votre bb montre des signes de [place]faim. Ceci  est appel l'allaitement  la demande." vitez d'introduire une sucette  votre bb pendant que vous travaillez pour tablir l'allaitement (les 4-6 premires semaines aprs la naissance de votre bb). Aprs ce temps, vous pouvez choisir d'utiliser Liberty Mediaune sucette. La recherche a montr que l'utilisation de la sucette au cours de la premire anne de la vie d'un bb diminue le risque de syndrome de mort subite du nourrisson [facility]). Laissez votre bb de se nourrir de chaque sein aussi longtemps qu'il ou elle veut. Allaiter jusqu' ce que votre bb est termin alimentation. Lorsque votre bb dverrouille ou tombe endormi tout en se nourrissant du premier sein, offrir le deuxime sein. Parce que les nouveau-ns sont souvent endormis dans les premires semaines de vie, vous devrez peut-tre rveiller votre bb pour Exelon Corporationobtenir lui pour se nourrir. temps d'allaitement varient d'un bb . Cependant, les rgles suivantes peuvent servir de guide pour vous aider  vous assurer que votre bb est bien nourri: Newborns (bbs 4 semaines d'ge ou plus jeune) peuvent allaiter toutes les 1-3 heures. Les nouveau-ns ne devraient pas aller plus de 3 heures pendant la journe ou 5 heures au cours de la nuit sans l'allaitement. Vous devriez Passenger transport managerallaiter votre bb un minimum de 8 fois dans une priode de 24 heures jusqu' ce que vous commencez  introduire des aliments solides  votre bb  environ 6 mois d'ge. LAIT MATERNEL POMPAGE Le pompage et le stockage du lait maternel permet de vous assurer que votre bb est exclusivement nourri le lait maternel, mme dans les moments o vous ne pouvez Research scientist (medical)pas allaiter. Ceci est particulirement important si vous Chartered certified accountantallez retourner au travail pendant que vous allaitez encore ou lorsque vous n'tes pas en mesure d'tre prsent pendant les ttes. Votre consultante en lactation peut vous donner des Yahoo! Inclignes directrices sur la dure, il est sr pour Medical illustratorstocker le lait maternel. Une pompe du sein est  une machine qui vous permet de pomper votre lait dans un flacon strile. Le lait maternel pomp peut alors tre Clinical biochemiststock dans un rfrigrateur ou Child psychotherapistd'un conglateur. Certaines pompes mammaires sont actionns  la main, tandis que d'autres utilisent l'lectricit. Demandez  votre consultante en lactation type qui fonctionnera le mieux pour vous. Tire-lait peuvent tre achets, mais certains hpitaux et Morgan Stanleydes groupes de soutien  l'allaitement louent les pompes du sein sur une base Champmensuelle. Une consultante en lactation peut vous apprendre  remettre le lait express du sein, si vous prfrez ne pas utiliser une pompe. ENTRETIEN DE VOS SEINS PENDANT QUE VOUS Allaiter Nipples peut devenir sche, fissure, et douloureux pendant l'allaitement. Les Oncologistrecommandations suivantes peuvent aider  garder vos seins hydrate et en bonne sant: Financial controllervitez d'utiliser du savon sur vos mamelons. Porter un soutien-gorge. Bien que non requis, soutiens-gorge de Designer, television/film setsoins infirmiers et dbardeurs spciaux sont conus pour Goodrich Corporationpermettre l'accs  vos seins pour l'allaitement sans enlever la totalit de votre soutien-gorge ou suprieure. vitez de porter des Newell Rubbermaidsoutiens-gorge  armatures de style ou des Doctor, hospitalsoutiens-gorge extrmement serr. Air scher vos mamelons pendant 3-4 minutes aprs chaque repas. Utilisez uniquement des tampons de coton de soutien-gorge pour Engineer, structuralabsorber le  lait maternel fuite. Fuite du lait maternel entre les ttes est normal. Utilisez la lanoline sur vos mamelons aprs l'allaitement. Lanoline aide  maintenir la barrire d'hydratation normale de votre peau. Si vous utilisez la lanoline pure, vous ne devez Gaffer de nourrir  nouveau votre bb. lanoline pure est pas toxique pour votre bb. Vous pouvez galement la main exprimer quelques gouttes de lait maternel et [employer] doucement que le lait dans vos mamelons et Public librarian. Dans les premires semaines aprs l'accouchement, certaines femmes ressentent  des seins extrmement complets (Engorgement). Engorgement peut rendre vos seins se sentent lourds, chaleureux et tendre Insurance underwriter. pics de Engorgement sous 3-5 jours aprs l'accouchement. Les Ecologist l'engorgement: Compltement vider vos seins pendant l'allaitement ou de pompage. Vous pouvez commencer par [employer] de chaleur, la chaleur humide (dans la douche ou avec des serviettes de toilette imbib d'eau chaude) juste avant l'alimentation ou de pompage. Cela augmente la circulation sanguine et favorise l'coulement du lait. Si votre bb ne se vide pas compltement vos seins pendant l'allaitement, la pompe de lait supplmentaire aprs qu'il ou elle est termine. Porter un soutien-gorge bien ajust (infirmier ou Health visitor) ou dbardeur pendant 1-2 jours pour Music therapist pour [employer] production de lait. Appliquez des compresses de Ball [employer] vos seins, sauf si cela est trop inconfortable pour vous. Assurez-vous que votre bb est verrouill sur et positionn Cytogeneticist. Si l'engorgement persiste aprs 48 heures de suite de [employer], Clinical cytogeneticist votre fournisseur de soins de sant ou une consultante en lactation. GLOBALES RECOMMANDATIONS DE SOINS DE SANT PENDANT L'ALLAITEMENT Mangez des [employer] Corporation. Alternez entre les repas et les collations, manger 3 de chaque par [employer]. Parce que ce que vous mangez affecte votre lait maternel, certains des aliments peut rendre votre bb plus irritable que d'habitude. vitez de manger ces aliments si vous tes sr qu'ils affectent ngativement votre bb. Buvez du lait, jus de fruits, et de l'eau pour Alcoa Inc (environ 10 verres par [employer]). Reposez-vous souvent, se dtendre et continuer  prendre vos vitamines prnatales pour viter la fatigue, le stress et l'anmie. Continuer les vrifications d'auto-sensibilisation du sein. vitez de Chartered certified accountant. vitez l'alcool et [employer] de drogues. Certains mdicaments qui peuvent tre nocifs pour votre bb peut passer  Proofreader. Il est important de demander  votre fournisseur de soins de sant avant de prendre Manufacturing systems engineer, y compris tous les over-the-counter et de [employer] ordonnance ainsi que des vitamines et des supplments  base de plantes. Il est possible de devenir enceinte pendant l'allaitement. Si le contrle des naissances est souhaite, demandez  votre fournisseur de soins de sant sur les options qui seront sans danger pour votre bb. SEEK SOINS MDICAUX SI: Vous vous sentez comme vous voulez arrter l'allaitement ou sont devenus frustrs par Statistician. Vous avez des seins ou des mamelons douloureux. Vos mamelons sont fissurs ou des saignements. Vos seins [employer], tendre, ou tide. Vous avez une zone enfle de chaque sein. Vous avez de la fivre ou des frissons. Vous avez des [place] ou des vomissements. Vous avez drainage autre que le lait maternel de vos mamelons. Vos seins ne deviennent pas complte avant les ttes par le cinquime jour aprs l'accouchement. Vous vous sentez triste et dprim. Votre bb est trop fatigu pour [facility]. Votre bb est d'avoir du mal  dormir. Votre bb mouille moins de 3 [employer]  une priode de 24 heures. Votre bb a moins de 3 selles dans une priode de 24 heures. La peau de votre bb ou la partie blanche de ses yeux devient jaune. Votre enfant ne prend Glass blower/designer en 5 jours d'ge. SEEK IMMDIATE SOINS MDICAUX SI: Votre bb est trop fatigu (lthargique) et ne veut pas se rveiller et se nourrir. Votre bb dveloppe une fivre inexplique. Document de Sortie: 01/20/2005 Document de rvision: 23/01/2013 Document de rvision: 12/09/2012 ExitCare Information pour les patients  2015 [place], [place]. Cette information ne vise pas  remplacer les conseils donns par votre  fournisseur de soins de sant. Assurez-vous de discuter de toutes les questions que vous avez avec votre fournisseur de soins de sant. a

## 2014-08-17 NOTE — Progress Notes (Signed)
Subjective:  Lauren Porter is a 34 y.o. G3P2001 at 5355w5d being seen today for ongoing prenatal care.  Patient reports no complaints.  Contractions: Irregular.  Vag. Bleeding: None. Movement: Present. Denies leaking of fluid.   The following portions of the patient's history were reviewed and updated as appropriate: allergies, current medications, past family history, past medical history, past social history, past surgical history and problem list.   Objective:   Filed Vitals:   08/17/14 1010  BP: 101/57  Pulse: 80  Weight: 232 lb (105.235 kg)    Fetal Status:     Movement: Present     General:  Alert, oriented and cooperative. Patient is in no acute distress.  Skin: Skin is warm and dry. No rash noted.   Cardiovascular: Normal heart rate noted  Respiratory: Normal respiratory effort, no problems with respiration noted  Abdomen: Soft, gravid, appropriate for gestational age. Pain/Pressure: Present     Vaginal: Vag. Bleeding: None.       Cervix: Not evaluated        Extremities: Normal range of motion.     Mental Status: Normal mood and affect. Normal behavior. Normal judgment and thought content.   Urinalysis: Urine Protein: Negative Urine Glucose: Negative NST reviewed and reactive.  Assessment and Plan:  Pregnancy: G3P2001 at 6855w5d  1. Prior pregnancy with fetal demise, antepartum, third trimester  - Fetal nonstress test  2. Previous cesarean section complicating pregnancy, antepartum condition or complication For TOLAC  3. Group B Streptococcus carrier, +RV culture, currently pregnant Needs treatment in labor  Term labor symptoms and general obstetric precautions including but not limited to vaginal bleeding, contractions, leaking of fluid and fetal movement were reviewed in detail with the patient. Please refer to After Visit Summary for other counseling recommendations.  Return in about 1 week (around 08/24/2014) for 2x/wk as scheduled.   Reva Boresanya S Lynnelle Mesmer, MD

## 2014-08-21 ENCOUNTER — Encounter (HOSPITAL_COMMUNITY): Payer: Self-pay | Admitting: *Deleted

## 2014-08-21 ENCOUNTER — Other Ambulatory Visit: Payer: Medicaid Other

## 2014-08-21 ENCOUNTER — Telehealth (HOSPITAL_COMMUNITY): Payer: Self-pay | Admitting: *Deleted

## 2014-08-21 NOTE — Telephone Encounter (Signed)
Preadmission screen Interpreter number [phone]

## 2014-08-22 ENCOUNTER — Ambulatory Visit (HOSPITAL_COMMUNITY)
Admission: RE | Admit: 2014-08-22 | Discharge: 2014-08-22 | Disposition: A | Discharge status: Home / Self Care | Payer: Medicaid Other | Source: Ambulatory Visit | Attending: Obstetrics and Gynecology | Admitting: Obstetrics and Gynecology

## 2014-08-22 ENCOUNTER — Ambulatory Visit (INDEPENDENT_AMBULATORY_CARE_PROVIDER_SITE_OTHER): Payer: Medicaid Other | Admitting: *Deleted

## 2014-08-22 VITALS — BP 107/47 | HR 81

## 2014-08-22 DIAGNOSIS — O09293 Supervision of pregnancy with other poor reproductive or obstetric history, third trimester: Secondary | ICD-10-CM

## 2014-08-22 DIAGNOSIS — Z3A38 38 weeks gestation of pregnancy: Secondary | ICD-10-CM | POA: Insufficient documentation

## 2014-08-22 NOTE — Progress Notes (Signed)
NST reactive.

## 2014-08-22 NOTE — Progress Notes (Signed)
Interpreter Areatha KeasMarie Ferron present for encounter.  US for growth today

## 2014-08-24 ENCOUNTER — Ambulatory Visit (INDEPENDENT_AMBULATORY_CARE_PROVIDER_SITE_OTHER): Payer: Medicaid Other | Admitting: Obstetrics & Gynecology

## 2014-08-24 VITALS — BP 104/51 | HR 86 | Wt 231.4 lb

## 2014-08-24 DIAGNOSIS — O09293 Supervision of pregnancy with other poor reproductive or obstetric history, third trimester: Secondary | ICD-10-CM

## 2014-08-24 DIAGNOSIS — O0993 Supervision of high risk pregnancy, unspecified, third trimester: Secondary | ICD-10-CM

## 2014-08-24 LAB — POCT URINALYSIS DIP (DEVICE)
Bilirubin Urine: NEGATIVE
GLUCOSE, UA: NEGATIVE mg/dL
Hgb urine dipstick: NEGATIVE
KETONES UR: NEGATIVE mg/dL
Nitrite: NEGATIVE
Protein, ur: NEGATIVE mg/dL
SPECIFIC GRAVITY, URINE: 1.015 (ref 1.005–1.030)
UROBILINOGEN UA: 0.2 mg/dL (ref 0.0–1.0)
pH: 7 (ref 5.0–8.0)

## 2014-08-24 NOTE — Progress Notes (Signed)
Moderate leukocytes noted on urinalysis. 

## 2014-08-24 NOTE — Progress Notes (Signed)
Subjective:decreased fetal movement  Lauren Porter is a 34 y.o. G3P2001 at [redacted]w[redacted]d being seen today for ongoing prenatal care.  Patient reports no leaking and occasional contractions.  Contractions: Irregular.  Vag. Bleeding: None. Movement: (!) Decreased. Denies leaking of fluid.   The following portions of the patient's history were reviewed and updated as appropriate: allergies, current medications, past family history, past medical history, past social history, past surgical history and problem list.   Objective:   Filed Vitals:   08/24/14 0756  BP: 104/51  Pulse: 86  Weight: 231 lb 6.4 oz (104.962 kg)    Fetal Status:     Movement: (!) Decreased     General:  Alert, oriented and cooperative. Patient is in no acute distress.  Skin: Skin is warm and dry. No rash noted.   Cardiovascular: Normal heart rate noted  Respiratory: Normal respiratory effort, no problems with respiration noted  Abdomen: Soft, gravid, appropriate for gestational age. Pain/Pressure: Present     Vaginal: Vag. Bleeding: None.       Cervix: Not evaluated        Extremities: Normal range of motion.  Edema: Mild pitting, slight indentation  Mental Status: Normal mood and affect. Normal behavior. Normal judgment and thought content.   Urinalysis:      Assessment and Plan:  Pregnancy: G3P2001 at [redacted]w[redacted]d  1. Prior pregnancy with fetal demise, antepartum, third trimester NST reactive today, IOL 39 weeks  2. Supervision of high risk pregnancy, antepartum, third trimester   Term labor symptoms and general obstetric precautions including but not limited to vaginal bleeding, contractions, leaking of fluid and fetal movement were reviewed in detail with the patient. Please refer to After Visit Summary for other counseling recommendations.  Return in about 6 weeks (around 10/05/2014) for PP visit.  IOL on 7/23.   Adam Phenix, MD 08/24/2014

## 2014-08-24 NOTE — Progress Notes (Signed)
Interpreter Areatha Keas present for encounter. Pt reports decreased FM since yesterday. IOL scheduled 7/22 @ 1145.  Breastfeeding tip of the week reviewed.

## 2014-08-24 NOTE — Patient Instructions (Signed)
Labor Induction  Labor induction is when steps are taken to cause a pregnant woman to begin the labor process. Most women go into labor on their own between 37 weeks and 42 weeks of the pregnancy. When this does not happen or when there is a medical need, methods may be used to induce labor. Labor induction causes a pregnant woman's uterus to contract. It also causes the cervix to soften (ripen), open (dilate), and thin out (efface). Usually, labor is not induced before 39 weeks of the pregnancy unless there is a problem with the baby or mother.  Before inducing labor, your health care provider will consider a number of factors, including the following:  The medical condition of you and the baby.   How many weeks along you are.   The status of the baby's lung maturity.   The condition of the cervix.   The position of the baby.  WHAT ARE THE REASONS FOR LABOR INDUCTION? Labor may be induced for the following reasons:  The health of the baby or mother is at risk.   The pregnancy is overdue by 1 week or more.   The water breaks but labor does not start on its own.   The mother has a health condition or serious illness, such as high blood pressure, infection, placental abruption, or diabetes.  The amniotic fluid amounts are low around the baby.   The baby is distressed.  Convenience or wanting the baby to be born on a certain date is not a reason for inducing labor. WHAT METHODS ARE USED FOR LABOR INDUCTION? Several methods of labor induction may be used, such as:   Prostaglandin medicine. This medicine causes the cervix to dilate and ripen. The medicine will also start contractions. It can be taken by mouth or by inserting a suppository into the vagina.   Inserting a thin tube (catheter) with a balloon on the end into the vagina to dilate the cervix. Once inserted, the balloon is expanded with water, which causes the cervix to open.   Stripping the membranes. Your health  care provider separates amniotic sac tissue from the cervix, causing the cervix to be stretched and causing the release of a hormone called progesterone. This may cause the uterus to contract. It is often done during an office visit. You will be sent home to wait for the contractions to begin. You will then come in for an induction.   Breaking the water. Your health care provider makes a hole in the amniotic sac using a small instrument. Once the amniotic sac breaks, contractions should begin. This may still take hours to see an effect.   Medicine to trigger or strengthen contractions. This medicine is given through an IV access tube inserted into a vein in your arm.  All of the methods of induction, besides stripping the membranes, will be done in the hospital. Induction is done in the hospital so that you and the baby can be carefully monitored.  HOW LONG DOES IT TAKE FOR LABOR TO BE INDUCED? Some inductions can take up to 2-3 days. Depending on the cervix, it usually takes less time. It takes longer when you are induced early in the pregnancy or if this is your first pregnancy. If a mother is still pregnant and the induction has been going on for 2-3 days, either the mother will be sent home or a cesarean delivery will be needed. WHAT ARE THE RISKS ASSOCIATED WITH LABOR INDUCTION? Some of the risks of induction   include:   Changes in fetal heart rate, such as too high, too low, or erratic.   Fetal distress.   Chance of infection for the mother and baby.   Increased chance of having a cesarean delivery.   Breaking off (abruption) of the placenta from the uterus (rare).   Uterine rupture (very rare).  When induction is needed for medical reasons, the benefits of induction may outweigh the risks. WHAT ARE SOME REASONS FOR NOT INDUCING LABOR? Labor induction should not be done if:   It is shown that your baby does not tolerate labor.   You have had previous surgeries on your  uterus, such as a myomectomy or the removal of fibroids.   Your placenta lies very low in the uterus and blocks the opening of the cervix (placenta previa).   Your baby is not in a head-down position.   The umbilical cord drops down into the birth canal in front of the baby. This could cut off the baby's blood and oxygen supply.   You have had a previous cesarean delivery.   There are unusual circumstances, such as the baby being extremely premature.  Document Released: 06/11/2006 Document Revised: 09/22/2012 Document Reviewed: 08/19/2012 [employer] Patient Information 2015 [employer]. This information is not intended to replace advice given to you by your health care provider. Make sure you discuss any questions you have with your health care provider.  

## 2014-08-25 VITALS — BP 113/61 | HR 74 | Temp 97.7°F | Resp 18 | Ht 65.0 in | Wt 231.0 lb

## 2014-08-26 ENCOUNTER — Inpatient Hospital Stay (HOSPITAL_COMMUNITY)
Admission: RE | Admit: 2014-08-26 | Discharge: 2014-08-29 | DRG: 766 | Disposition: A | Discharge status: Home / Self Care | Payer: Medicaid Other | Source: Ambulatory Visit | Attending: Obstetrics & Gynecology | Admitting: Obstetrics & Gynecology

## 2014-08-26 ENCOUNTER — Encounter (HOSPITAL_COMMUNITY): Payer: Self-pay

## 2014-08-26 DIAGNOSIS — O3421 Maternal care for scar from previous cesarean delivery: Principal | ICD-10-CM | POA: Diagnosis present

## 2014-08-26 DIAGNOSIS — Z3A39 39 weeks gestation of pregnancy: Secondary | ICD-10-CM | POA: Diagnosis present

## 2014-08-26 DIAGNOSIS — O99213 Obesity complicating pregnancy, third trimester: Secondary | ICD-10-CM

## 2014-08-26 DIAGNOSIS — O09293 Supervision of pregnancy with other poor reproductive or obstetric history, third trimester: Secondary | ICD-10-CM | POA: Diagnosis present

## 2014-08-26 DIAGNOSIS — O34219 Maternal care for unspecified type scar from previous cesarean delivery: Secondary | ICD-10-CM

## 2014-08-26 DIAGNOSIS — N858 Other specified noninflammatory disorders of uterus: Secondary | ICD-10-CM | POA: Diagnosis present

## 2014-08-26 DIAGNOSIS — O99824 Streptococcus B carrier state complicating childbirth: Secondary | ICD-10-CM | POA: Diagnosis present

## 2014-08-26 DIAGNOSIS — O99820 Streptococcus B carrier state complicating pregnancy: Secondary | ICD-10-CM

## 2014-08-26 DIAGNOSIS — O09523 Supervision of elderly multigravida, third trimester: Secondary | ICD-10-CM | POA: Diagnosis not present

## 2014-08-26 DIAGNOSIS — O0933 Supervision of pregnancy with insufficient antenatal care, third trimester: Secondary | ICD-10-CM

## 2014-08-26 DIAGNOSIS — O0993 Supervision of high risk pregnancy, unspecified, third trimester: Secondary | ICD-10-CM

## 2014-08-26 DIAGNOSIS — O09299 Supervision of pregnancy with other poor reproductive or obstetric history, unspecified trimester: Secondary | ICD-10-CM

## 2014-08-26 LAB — CBC
HEMATOCRIT: 32.8 % — AB (ref 36.0–46.0)
HEMOGLOBIN: 11.2 g/dL — AB (ref 12.0–15.0)
MCH: 29.1 pg (ref 26.0–34.0)
MCHC: 34.1 g/dL (ref 30.0–36.0)
MCV: 85.2 fL (ref 78.0–100.0)
Platelets: 147 10*3/uL — ABNORMAL LOW (ref 150–400)
RBC: 3.85 MIL/uL — AB (ref 3.87–5.11)
RDW: 15.7 % — AB (ref 11.5–15.5)
WBC: 8.3 10*3/uL (ref 4.0–10.5)

## 2014-08-26 LAB — TYPE AND SCREEN
ABO/RH(D): O POS
Antibody Screen: NEGATIVE

## 2014-08-26 LAB — ABO/RH: ABO/RH(D): O POS

## 2014-08-26 LAB — RPR: RPR Ser Ql: NONREACTIVE

## 2014-08-26 LAB — HIV ANTIBODY (ROUTINE TESTING W REFLEX): HIV SCREEN 4TH GENERATION: NONREACTIVE

## 2014-08-26 MED ORDER — PENICILLIN G POTASSIUM 5000000 UNITS IJ SOLR
5.0000 10*6.[IU] | Freq: Once | INTRAVENOUS | Status: AC
  Administered 2014-08-26: 5 10*6.[IU] via INTRAVENOUS
  Filled 2014-08-26: qty 5

## 2014-08-26 MED ORDER — OXYTOCIN 40 UNITS IN LACTATED RINGERS INFUSION - SIMPLE MED
1.0000 m[IU]/min | INTRAVENOUS | Status: DC
  Administered 2014-08-26: 2 m[IU]/min via INTRAVENOUS
  Filled 2014-08-26: qty 1000

## 2014-08-26 MED ORDER — LIDOCAINE HCL (PF) 1 % IJ SOLN: 30.0000 mL | INTRAMUSCULAR | Status: DC | PRN

## 2014-08-26 MED ORDER — CITRIC ACID-SODIUM CITRATE 334-500 MG/5ML PO SOLN
30.0000 mL | ORAL | Status: DC | PRN
  Administered 2014-08-27: 30 mL via ORAL
  Filled 2014-08-26: qty 15

## 2014-08-26 MED ORDER — LACTATED RINGERS IV SOLN
INTRAVENOUS | Status: DC
  Administered 2014-08-26 – 2014-08-27 (×4): via INTRAVENOUS

## 2014-08-26 MED ORDER — LACTATED RINGERS IV SOLN
500.0000 mL | INTRAVENOUS | Status: DC | PRN
Start: 2014-08-26 — End: 2014-08-27

## 2014-08-26 MED ORDER — OXYCODONE-ACETAMINOPHEN 5-325 MG PO TABS: 2.0000 | ORAL_TABLET | ORAL | Status: DC | PRN

## 2014-08-26 MED ORDER — ONDANSETRON HCL 4 MG/2ML IJ SOLN: 4.0000 mg | Freq: Four times a day (QID) | INTRAMUSCULAR | Status: DC | PRN

## 2014-08-26 MED ORDER — TERBUTALINE SULFATE 1 MG/ML IJ SOLN: 0.2500 mg | Freq: Once | INTRAMUSCULAR | Status: AC | PRN

## 2014-08-26 MED ORDER — FLEET ENEMA 7-19 GM/118ML RE ENEM: 1.0000 | ENEMA | RECTAL | Status: DC | PRN

## 2014-08-26 MED ORDER — OXYTOCIN 40 UNITS IN LACTATED RINGERS INFUSION - SIMPLE MED: 62.5000 mL/h | INTRAVENOUS | Status: DC

## 2014-08-26 MED ORDER — OXYCODONE-ACETAMINOPHEN 5-325 MG PO TABS: 1.0000 | ORAL_TABLET | ORAL | Status: DC | PRN

## 2014-08-26 MED ORDER — PENICILLIN G POTASSIUM 5000000 UNITS IJ SOLR
2.5000 10*6.[IU] | INTRAVENOUS | Status: DC
  Administered 2014-08-26 – 2014-08-27 (×5): 2.5 10*6.[IU] via INTRAVENOUS
  Filled 2014-08-26 (×9): qty 2.5

## 2014-08-26 MED ORDER — ACETAMINOPHEN 325 MG PO TABS: 650.0000 mg | ORAL_TABLET | ORAL | Status: DC | PRN

## 2014-08-26 MED ORDER — FENTANYL CITRATE (PF) 100 MCG/2ML IJ SOLN
100.0000 ug | INTRAMUSCULAR | Status: DC | PRN
  Administered 2014-08-26: 100 ug via INTRAVENOUS
  Administered 2014-08-27: 10 ug via INTRAVENOUS
  Filled 2014-08-26: qty 2

## 2014-08-26 MED ORDER — OXYTOCIN BOLUS FROM INFUSION: 500.0000 mL | INTRAVENOUS | Status: DC

## 2014-08-26 NOTE — Progress Notes (Signed)
Per provider order, pitocin and monitoring discontinued until 2200 to allow pt time to eat and shower. FHT 130 with variability and accelerations.

## 2014-08-26 NOTE — Progress Notes (Signed)
Patient ID: Lauren Porter, female   DOB: [birth year], 34 y.o.   MRN: [id number] . Lauren Porter is a 34 y.o. G3P2001 at [redacted]w[redacted]d by ultrasound admitted for IOL 2/2 h/o IUFD.  Subjective:  Pt has no complaints and reports pain is well controlled.   Objective: BP 122/71 mmHg  Pulse 103  Temp(Src) 98.3 F (36.8 C) (Oral)  Resp 20  Ht  (1.651 m)  Wt 104.781 kg (231 lb)  BMI 38.44 kg/m2  LMP 01/17/2014      FHT:  FHR: 135 bpm, variability: moderate,  accelerations:  Present,  decelerations:  Absent UC:   regular, every 3 minutes SVE:   Dilation: 3 Effacement (%): Thick Station: -3 Exam by:: k fields, rn  Labs: Lab Results  Component Value Date   WBC 8.3 08/26/2014   HGB 11.2* 08/26/2014   HCT 32.8* 08/26/2014   MCV 85.2 08/26/2014   PLT 147* 08/26/2014    Assessment / Plan: Induction of labor due to h/o IUFD,  with FB in place  Labor: Progressing normally with pitocin Preeclampsia:   Fetal Wellbeing:  Category I Pain Control:  Labor support without medications Anticipated MOD:  NSVD  Parks Ranger 08/26/2014, 1:35 PM

## 2014-08-26 NOTE — H&P (Signed)
HPI: Lauren Porter is a 34 y.o. year old G10P2001 female at [redacted]w[redacted]d weeks gestation by 59 week [place] who presents to [facility] for IOL due to Hx IUFD. Prior C/S x 1 for CPD (9-13 baby) and term IUFD for unknown reason, VBAC 7-4 baby. Started PNC at 26 weeks.   Clinic HD--> [facility] Prenatal Labs  Dating  26 week ultrasound Blood type: O/Positive/-- (04/21 0000)   Genetic Screen NIPS: declined. Quad abnl, but not accurate due to being drawn too late. CF neg.  Antibody:Negative (04/21 0000)  Anatomic [place] Normal Rubella: Immune (04/21 0000)  GTT  Third trimester: 134 RPR: Nonreactive (04/21 0000)   Flu vaccine  05/25/2014 HBsAg: Negative (04/21 0000)   TDaP vaccine  06/29/2014                                Rhogam: HIV:  Nonreactive  GBS   Pos                                           (For PCN allergy, check sensitivities) GBS: pos  Contraception  undecided Pap: normal (05/25/2014)  Baby [employer]  breast   Circumcision N/A - girl   Pediatrician Undecided   Support Person Husband    Maternal Medical History:  Reason for admission: Nausea. IOL due to hx of IUFD in previous pregnancy.  Contractions: Onset was less than 1 hour ago.   Frequency: regular.   Perceived severity is mild.    Fetal activity: Perceived fetal activity is normal.    Prenatal Complications - Diabetes: none.    OB History    Gravida Para Term Preterm AB TAB SAB Ectopic Multiple Living   0 0  0   1     No past medical history on file. Past Surgical History  Procedure Laterality Date  . Cesarean section     Family History: family history includes Asthma in her father; Diabetes in her mother. Social History:  reports that she has never smoked. She has never used smokeless tobacco. She reports that she does not drink alcohol or use illicit drugs.   Prenatal Transfer Tool  Maternal Diabetes: No Genetic Screening: Abnormal:  Results: Other: Maternal Ultrasounds/Referrals: Normal Fetal Ultrasounds or other  Referrals:  None, Fetal echo Maternal Substance Abuse:  No Significant Maternal Medications:  None Significant Maternal Lab Results:  Lab values include: Group B Strep positive Other Comments:  NIPS: declined. Quad abnl, but not accurate due to being drawn too late. CF neg.   Review of Systems  Constitutional: Negative for fever and chills.  Cardiovascular: Negative for chest pain and leg swelling.  Gastrointestinal: Negative for nausea, vomiting and abdominal pain.  Genitourinary:       No vaginal bleeding or discharge.  Neurological: Negative for headaches.      Blood pressure 124/59, pulse 78, temperature 97.9 F (36.6 C), temperature source Oral, resp. rate 20, height  (1.651 m), weight 231 lb (104.781 kg), last menstrual period 01/17/2014. Maternal Exam:  Uterine Assessment: Contraction strength is mild.  Contraction frequency is regular.   Abdomen: Patient reports no abdominal tenderness. Estimated fetal weight is 3900g per ultrasound on 08/22/2014.   Fetal presentation: vertex  Introitus: Normal vulva. Normal vagina.  Pelvis: adequate for delivery.   Cervix: Cervix evaluated by  sterile speculum exam and digital exam.     Fetal Exam Fetal Monitor Review: Mode: ultrasound.   Variability: moderate (6-25 bpm).   Pattern: accelerations present and no decelerations.    Fetal State Assessment: Category I - tracings are normal.     Physical Exam  Nursing note and vitals reviewed. Constitutional: She is oriented to person, place, and time. She appears well-developed and well-nourished. No distress.  HENT:  Head: Normocephalic and atraumatic.  Eyes: Conjunctivae and EOM are normal.  Cardiovascular: Normal rate, regular rhythm and normal heart sounds.   Respiratory: Effort normal and breath sounds normal. No respiratory distress.  GI: Soft. She exhibits no distension. There is no tenderness.  Genitourinary: Vagina normal.  SVE: 1/Thick/-3, completed by Lynden Ang, RN No  vaginal discharge or bleeding noted on vaginal exam  Musculoskeletal: She exhibits no edema.  Neurological: She is alert and oriented to person, place, and time.  Skin: Skin is warm and dry.  Psychiatric: She has a normal mood and affect. Her behavior is normal. Judgment and thought content normal.    Prenatal labs: ABO, Rh: O/Positive/-- (04/21 0000) Antibody: Negative (04/21 0000) Rubella: Immune (04/21 0000) RPR: Nonreactive (04/21 0000)  HBsAg: Negative (04/21 0000)  HIV: Non-reactive (04/22 0000)  GBS: Positive (06/30 0000)   Assessment: 1. Labor: IOL for Hx IUFD 2. Fetal Wellbeing: Category I  3. Pain Control: Comfort measures 4. GBS: Pos 5. 39.0 week IUP 6. Hx IUFD 7. TOLAC (Consent under Media tab)  Plan:  1. Admit to BS per consult with MD 2. Routine L&D orders 3. Analgesia/anesthesia PRN  4. Foley Bulb for IOL, then pitocin 5. PCN for GBS prophylaxis.   Etienne Mowers 08/26/2014, 1:15 AM

## 2014-08-26 NOTE — Progress Notes (Signed)
Lauren Porter is a 34 y.o. G3P2001 at [redacted]w[redacted]d by ultrasound admitted for induction of labor due to prev IUFD.  Subjective:   Objective: BP 111/58 mmHg  Pulse 77  Temp(Src) 98.3 F (36.8 C) (Oral)  Resp 18  Ht  (1.651 m)  Wt 231 lb (104.781 kg)  BMI 38.44 kg/m2  LMP 01/17/2014      FHT:  FHR: 130 bpm, variability: moderate,  accelerations:  Present,  decelerations:  Absent UC:   regular, every 4 minutes SVE:   Dilation: 1 Effacement (%): Thick Station: -3 Exam by:: d lawson, cnm  Labs: Lab Results  Component Value Date   WBC 8.3 08/26/2014   HGB 11.2* 08/26/2014   HCT 32.8* 08/26/2014   MCV 85.2 08/26/2014   PLT 147* 08/26/2014    Assessment / Plan: Induction of labor due to  prev IUFD,  progressing well on pitocin  Labor: Progressing normally Preeclampsia:  no signs or symptoms of toxicity and intake and ouput balanced Fetal Wellbeing:  Category I Pain Control:  Labor support without medications I/D:  n/a Anticipated MOD:  NSVD  LAWSON, MARIE [name] 08/26/2014, 11:39 AM

## 2014-08-26 NOTE — Plan of Care (Signed)
Problem: Consults Goal: Birthing Suites Patient Information Press F2 to bring up selections list Outcome: Completed/Met Date Met:  08/26/14  Pt 37-[redacted] weeks EGA, Inpatient induction and Non-English Speaking

## 2014-08-26 NOTE — Progress Notes (Signed)
Lauren Porter is a 34 y.o. G3P2001 at [redacted]w[redacted]d by ultrasound admitted for induction of labor due to prev IUFD at term.  Subjective:   Objective: BP 110/57 mmHg  Pulse 79  Temp(Src) 98.1 F (36.7 C) (Oral)  Resp 20  Ht  (1.651 m)  Wt 231 lb (104.781 kg)  BMI 38.44 kg/m2  LMP 01/17/2014      FHT:  FHR: 130 bpm, variability: moderate,  accelerations:  Present,  decelerations:  Absent UC:   regular, every 3 minutes and mild SVE:   Dilation: 3 Effacement (%): Thick Station: -3 Exam by:: k fields, rn  Labs: Lab Results  Component Value Date   WBC 8.3 08/26/2014   HGB 11.2* 08/26/2014   HCT 32.8* 08/26/2014   MCV 85.2 08/26/2014   PLT 147* 08/26/2014    Assessment / Plan: Induction of labor due to prev fetal demise,  progressing well on pitocin  Labor: Progressing on Pitocin, will continue to increase then AROM Preeclampsia:  no signs or symptoms of toxicity and intake and ouput balanced Fetal Wellbeing:  Category I Pain Control:  Labor support without medications I/D:  n/a Anticipated MOD:  NSVD  Lauren Porter [name] 08/26/2014, 5:38 PM

## 2014-08-26 NOTE — Progress Notes (Signed)
   Lauren Porter is a 34 y.o. G3P2001 at [redacted]w[redacted]d  admitted for induction of labor due to Hx of previous IUFD in trimester.  Subjective: Coping well.   Objective: Filed Vitals:   08/26/14 1902 08/26/14 1933 08/26/14 2002 08/26/14 2222  BP: 94/52 110/57 113/65 118/59  Pulse: 80 81 80 90  Temp:  98.2 F (36.8 C)  97.6 F (36.4 C)  TempSrc:    Oral  Resp: 20 16    Height:      Weight:          FHT:  FHR: 145 bpm, variability: moderate,  accelerations:  Present,  decelerations:  Absent UC:   none SVE:   Dilation: 4 Effacement (%): Thick Station: Ballotable Exam by:: K Caylon Saine Pitocin @ 2 mu/min  Labs: Lab Results  Component Value Date   WBC 8.3 08/26/2014   HGB 11.2* 08/26/2014   HCT 32.8* 08/26/2014   MCV 85.2 08/26/2014   PLT 147* 08/26/2014    Assessment / Plan: induction of labor, still in early labor  Labor: after Pitocin break, will restart pitocin to achieve regular contraction pattern Fetal Wellbeing:  Category I Pain Control:  Labor support without medications Anticipated MOD:  NSVD  Charlesetta Garibaldi Tu Shimmel SNM 08/26/2014, 11:18 PM

## 2014-08-27 ENCOUNTER — Encounter (HOSPITAL_COMMUNITY): Payer: Self-pay

## 2014-08-27 ENCOUNTER — Encounter (HOSPITAL_COMMUNITY)
Admission: RE | Disposition: A | Discharge status: Home / Self Care | Payer: Self-pay | Source: Ambulatory Visit | Attending: Obstetrics & Gynecology

## 2014-08-27 ENCOUNTER — Inpatient Hospital Stay (HOSPITAL_COMMUNITY): Payer: Medicaid Other | Admitting: Anesthesiology

## 2014-08-27 DIAGNOSIS — Z3A39 39 weeks gestation of pregnancy: Secondary | ICD-10-CM

## 2014-08-27 DIAGNOSIS — O3421 Maternal care for scar from previous cesarean delivery: Secondary | ICD-10-CM

## 2014-08-27 LAB — CBC
HCT: 28.8 % — ABNORMAL LOW (ref 36.0–46.0)
Hemoglobin: 9.7 g/dL — ABNORMAL LOW (ref 12.0–15.0)
MCH: 28.8 pg (ref 26.0–34.0)
MCHC: 33.7 g/dL (ref 30.0–36.0)
MCV: 85.5 fL (ref 78.0–100.0)
Platelets: 146 10*3/uL — ABNORMAL LOW (ref 150–400)
RBC: 3.37 MIL/uL — ABNORMAL LOW (ref 3.87–5.11)
RDW: 15.7 % — AB (ref 11.5–15.5)
WBC: 7.6 10*3/uL (ref 4.0–10.5)

## 2014-08-27 SURGERY — Surgical Case
Anesthesia: Spinal | Site: Abdomen

## 2014-08-27 MED ORDER — BUPIVACAINE IN DEXTROSE 0.75-8.25 % IT SOLN
INTRATHECAL | Status: DC | PRN
  Administered 2014-08-27: 1.7 mL via INTRATHECAL

## 2014-08-27 MED ORDER — NALOXONE HCL 1 MG/ML IJ SOLN
1.0000 ug/kg/h | INTRAVENOUS | Status: DC | PRN
  Filled 2014-08-27: qty 2

## 2014-08-27 MED ORDER — MORPHINE SULFATE 0.5 MG/ML IJ SOLN
INTRAMUSCULAR | Status: AC
  Filled 2014-08-27: qty 10

## 2014-08-27 MED ORDER — LACTATED RINGERS IV SOLN
INTRAVENOUS | Status: DC | PRN
  Administered 2014-08-27: 10:00:00 via INTRAVENOUS

## 2014-08-27 MED ORDER — NALBUPHINE HCL 10 MG/ML IJ SOLN: 5.0000 mg | INTRAMUSCULAR | Status: DC | PRN

## 2014-08-27 MED ORDER — SIMETHICONE 80 MG PO CHEW: 80.0000 mg | CHEWABLE_TABLET | ORAL | Status: DC | PRN

## 2014-08-27 MED ORDER — SENNOSIDES-DOCUSATE SODIUM 8.6-50 MG PO TABS
2.0000 | ORAL_TABLET | ORAL | Status: DC
  Administered 2014-08-27 – 2014-08-28 (×2): 2 via ORAL
  Filled 2014-08-27 (×2): qty 2

## 2014-08-27 MED ORDER — SODIUM CHLORIDE 0.9 % IJ SOLN: 3.0000 mL | INTRAMUSCULAR | Status: DC | PRN

## 2014-08-27 MED ORDER — MEPERIDINE HCL 25 MG/ML IJ SOLN: 6.2500 mg | INTRAMUSCULAR | Status: DC | PRN

## 2014-08-27 MED ORDER — ACETAMINOPHEN 325 MG PO TABS: 650.0000 mg | ORAL_TABLET | ORAL | Status: DC | PRN

## 2014-08-27 MED ORDER — SIMETHICONE 80 MG PO CHEW
80.0000 mg | CHEWABLE_TABLET | ORAL | Status: DC
  Administered 2014-08-27 – 2014-08-28 (×2): 80 mg via ORAL
  Filled 2014-08-27 (×2): qty 1

## 2014-08-27 MED ORDER — LANOLIN HYDROUS EX OINT: 1.0000 "application " | TOPICAL_OINTMENT | CUTANEOUS | Status: DC | PRN

## 2014-08-27 MED ORDER — OXYTOCIN 10 UNIT/ML IJ SOLN
40.0000 [IU] | INTRAVENOUS | Status: DC | PRN
  Administered 2014-08-27: 40 [IU] via INTRAVENOUS

## 2014-08-27 MED ORDER — OXYTOCIN 10 UNIT/ML IJ SOLN
INTRAMUSCULAR | Status: AC
  Filled 2014-08-27: qty 4

## 2014-08-27 MED ORDER — KETOROLAC TROMETHAMINE 30 MG/ML IJ SOLN: 30.0000 mg | Freq: Four times a day (QID) | INTRAMUSCULAR | Status: DC | PRN

## 2014-08-27 MED ORDER — PRENATAL MULTIVITAMIN CH
1.0000 | ORAL_TABLET | Freq: Every day | ORAL | Status: DC
  Administered 2014-08-28 – 2014-08-29 (×2): 1 via ORAL
  Filled 2014-08-27 (×2): qty 1

## 2014-08-27 MED ORDER — CEFAZOLIN SODIUM-DEXTROSE 2-3 GM-% IV SOLR
INTRAVENOUS | Status: AC
  Filled 2014-08-27: qty 50

## 2014-08-27 MED ORDER — NALBUPHINE HCL 10 MG/ML IJ SOLN: 5.0000 mg | Freq: Once | INTRAMUSCULAR | Status: DC | PRN

## 2014-08-27 MED ORDER — LACTATED RINGERS IV SOLN
INTRAVENOUS | Status: DC | PRN
  Administered 2014-08-27 (×4): via INTRAVENOUS

## 2014-08-27 MED ORDER — MORPHINE SULFATE (PF) 0.5 MG/ML IJ SOLN
INTRAMUSCULAR | Status: DC | PRN
  Administered 2014-08-27: .2 mg via INTRATHECAL

## 2014-08-27 MED ORDER — TETANUS-DIPHTH-ACELL PERTUSSIS 5-2.5-18.5 LF-MCG/0.5 IM SUSP: 0.5000 mL | Freq: Once | INTRAMUSCULAR | Status: DC

## 2014-08-27 MED ORDER — ACETAMINOPHEN 500 MG PO TABS: 1000.0000 mg | ORAL_TABLET | Freq: Four times a day (QID) | ORAL | Status: DC

## 2014-08-27 MED ORDER — CEFAZOLIN SODIUM-DEXTROSE 2-3 GM-% IV SOLR
2.0000 g | INTRAVENOUS | Status: AC
  Administered 2014-08-27: 2 g via INTRAVENOUS
  Filled 2014-08-27: qty 50

## 2014-08-27 MED ORDER — DIPHENHYDRAMINE HCL 50 MG/ML IJ SOLN: 12.5000 mg | INTRAMUSCULAR | Status: DC | PRN

## 2014-08-27 MED ORDER — ONDANSETRON HCL 4 MG/2ML IJ SOLN
INTRAMUSCULAR | Status: DC | PRN
  Administered 2014-08-27: 4 mg via INTRAVENOUS

## 2014-08-27 MED ORDER — LIDOCAINE HCL (PF) 1 % IJ SOLN
INTRAMUSCULAR | Status: AC
  Filled 2014-08-27: qty 5

## 2014-08-27 MED ORDER — ONDANSETRON HCL 4 MG/2ML IJ SOLN
INTRAMUSCULAR | Status: AC
  Filled 2014-08-27: qty 2

## 2014-08-27 MED ORDER — WITCH HAZEL-GLYCERIN EX PADS: 1.0000 "application " | MEDICATED_PAD | CUTANEOUS | Status: DC | PRN

## 2014-08-27 MED ORDER — SIMETHICONE 80 MG PO CHEW
80.0000 mg | CHEWABLE_TABLET | Freq: Three times a day (TID) | ORAL | Status: DC
  Administered 2014-08-27 – 2014-08-29 (×4): 80 mg via ORAL
  Filled 2014-08-27 (×5): qty 1

## 2014-08-27 MED ORDER — DIPHENHYDRAMINE HCL 25 MG PO CAPS
25.0000 mg | ORAL_CAPSULE | ORAL | Status: DC | PRN
  Filled 2014-08-27: qty 1

## 2014-08-27 MED ORDER — OXYCODONE-ACETAMINOPHEN 5-325 MG PO TABS
1.0000 | ORAL_TABLET | ORAL | Status: DC | PRN
  Administered 2014-08-28 – 2014-08-29 (×2): 1 via ORAL
  Filled 2014-08-27 (×2): qty 1

## 2014-08-27 MED ORDER — PHENYLEPHRINE 8 MG IN D5W 100 ML (0.08MG/ML) PREMIX OPTIME
INJECTION | INTRAVENOUS | Status: AC
  Filled 2014-08-27: qty 100

## 2014-08-27 MED ORDER — PROMETHAZINE HCL 25 MG/ML IJ SOLN: 6.2500 mg | INTRAMUSCULAR | Status: DC | PRN

## 2014-08-27 MED ORDER — NALOXONE HCL 0.4 MG/ML IJ SOLN: 0.4000 mg | INTRAMUSCULAR | Status: DC | PRN

## 2014-08-27 MED ORDER — ONDANSETRON HCL 4 MG/2ML IJ SOLN: 4.0000 mg | Freq: Three times a day (TID) | INTRAMUSCULAR | Status: DC | PRN

## 2014-08-27 MED ORDER — OXYCODONE-ACETAMINOPHEN 5-325 MG PO TABS: 2.0000 | ORAL_TABLET | ORAL | Status: DC | PRN

## 2014-08-27 MED ORDER — DIBUCAINE 1 % RE OINT: 1.0000 "application " | TOPICAL_OINTMENT | RECTAL | Status: DC | PRN

## 2014-08-27 MED ORDER — FENTANYL CITRATE (PF) 100 MCG/2ML IJ SOLN: 25.0000 ug | INTRAMUSCULAR | Status: DC | PRN

## 2014-08-27 MED ORDER — FENTANYL CITRATE (PF) 100 MCG/2ML IJ SOLN
INTRAMUSCULAR | Status: AC
  Filled 2014-08-27: qty 2

## 2014-08-27 MED ORDER — MENTHOL 3 MG MT LOZG: 1.0000 | LOZENGE | OROMUCOSAL | Status: DC | PRN

## 2014-08-27 MED ORDER — SCOPOLAMINE 1 MG/3DAYS TD PT72: 1.0000 | MEDICATED_PATCH | Freq: Once | TRANSDERMAL | Status: DC

## 2014-08-27 MED ORDER — OXYTOCIN 40 UNITS IN LACTATED RINGERS INFUSION - SIMPLE MED: 62.5000 mL/h | INTRAVENOUS | Status: AC

## 2014-08-27 MED ORDER — DIPHENHYDRAMINE HCL 25 MG PO CAPS: 25.0000 mg | ORAL_CAPSULE | Freq: Four times a day (QID) | ORAL | Status: DC | PRN

## 2014-08-27 MED ORDER — IBUPROFEN 600 MG PO TABS
600.0000 mg | ORAL_TABLET | Freq: Four times a day (QID) | ORAL | Status: DC
  Administered 2014-08-27 – 2014-08-29 (×8): 600 mg via ORAL
  Filled 2014-08-27 (×8): qty 1

## 2014-08-27 MED ORDER — PHENYLEPHRINE 8 MG IN D5W 100 ML (0.08MG/ML) PREMIX OPTIME
INJECTION | INTRAVENOUS | Status: DC | PRN
  Administered 2014-08-27: 60 ug/min via INTRAVENOUS

## 2014-08-27 MED ORDER — LACTATED RINGERS IV SOLN
INTRAVENOUS | Status: DC
  Administered 2014-08-27 – 2014-08-28 (×2): via INTRAVENOUS

## 2014-08-27 SURGICAL SUPPLY — 27 items
CLAMP CORD UMBIL (MISCELLANEOUS) IMPLANT
CONTAINER PREFILL 10% NBF 15ML (MISCELLANEOUS) IMPLANT
DRAPE SHEET LG 3/4 BI-LAMINATE (DRAPES) IMPLANT
DRSG OPSITE POSTOP 4X10 (GAUZE/BANDAGES/DRESSINGS) ×3 IMPLANT
DURAPREP 26ML APPLICATOR (WOUND CARE) ×3 IMPLANT
ELECT REM PT RETURN 9FT ADLT (ELECTROSURGICAL) ×3
ELECTRODE REM PT RTRN 9FT ADLT (ELECTROSURGICAL) ×1 IMPLANT
EXTRACTOR VACUUM M CUP 4 TUBE (SUCTIONS) IMPLANT
EXTRACTOR VACUUM M CUP 4' TUBE (SUCTIONS)
GLOVE BIOGEL PI IND STRL 6.5 (GLOVE) ×1 IMPLANT
GLOVE BIOGEL PI INDICATOR 6.5 (GLOVE) ×2
GLOVE SURG SS PI 6.0 STRL IVOR (GLOVE) ×3 IMPLANT
GOWN STRL REUS W/TWL LRG LVL3 (GOWN DISPOSABLE) ×6 IMPLANT
KIT ABG SYR 3ML LUER SLIP (SYRINGE) IMPLANT
NEEDLE HYPO 25X5/8 SAFETYGLIDE (NEEDLE) IMPLANT
NS IRRIG 1000ML POUR BTL (IV SOLUTION) ×3 IMPLANT
PACK C SECTION WH (CUSTOM PROCEDURE TRAY) ×3 IMPLANT
PAD ABD 8X7 1/2 STERILE (GAUZE/BANDAGES/DRESSINGS) ×3 IMPLANT
PAD OB MATERNITY 4.3X12.25 (PERSONAL CARE ITEMS) ×3 IMPLANT
RTRCTR C-SECT PINK 25CM LRG (MISCELLANEOUS) IMPLANT
SEPRAFILM MEMBRANE 5X6 (MISCELLANEOUS) IMPLANT
SPONGE GAUZE 4X4 12PLY STER LF (GAUZE/BANDAGES/DRESSINGS) ×3 IMPLANT
SUT PLAIN 0 NONE (SUTURE) IMPLANT
SUT VIC AB 0 CT1 36 (SUTURE) ×12 IMPLANT
SUT VIC AB 4-0 KS 27 (SUTURE) ×3 IMPLANT
TOWEL OR 17X24 6PK STRL BLUE (TOWEL DISPOSABLE) ×3 IMPLANT
TRAY FOLEY CATH SILVER 14FR (SET/KITS/TRAYS/PACK) ×3 IMPLANT

## 2014-08-27 NOTE — Consult Note (Signed)
Neonatology Note:   Attendance at C-section:   I was asked by Dr. Constant to attend this repeat C/S at term after TOLAC and FTP. The mother is a G3P2 O pos, GBS pos with an otherwise uncomplicated pregnancy. ROM at delivery, fluid clear. Infant vigorous with good spontaneous cry and tone. Needed only minimal bulb suctioning. Ap 9/9. Lungs clear to ausc in DR. To CN to care of Pediatrician.  Lauren Butterfield C. Zailen Albarran, MD 

## 2014-08-27 NOTE — Op Note (Signed)
Lauren Porter PROCEDURE DATE: 08/26/2014 - 08/27/2014  PREOPERATIVE DIAGNOSIS: Intrauterine pregnancy at  [redacted]w[redacted]d weeks gestation; patient declines vag del attempt  POSTOPERATIVE DIAGNOSIS: The same  PROCEDURE:     Cesarean Section  SURGEON:  Dr. Catalina Antigua  ASSISTANT: none  INDICATIONS: Lauren Porter is a 34 y.o. G3P2001 at [redacted]w[redacted]d scheduled for cesarean section secondary to patient declines vag del attempt.  Patient was being induced at 39 weeks secondary to h/o IUFD. She progressed to 4 cm and a high station. Her labor was augmented with pitocin but patient failed to progress past 4 cm after 20 hours. She opted to proceed with cesarean delivery. The risks of cesarean section discussed with the patient included but were not limited to: bleeding which may require transfusion or reoperation; infection which may require antibiotics; injury to bowel, bladder, ureters or other surrounding organs; injury to the fetus; need for additional procedures including hysterectomy in the event of a life-threatening hemorrhage; placental abnormalities wth subsequent pregnancies, incisional problems, thromboembolic phenomenon and other postoperative/anesthesia complications. The patient concurred with the proposed plan, giving informed written consent for the procedure.    FINDINGS:  Viable female infant in cephalic presentation.  Apgars 9 and 9, weight, 7 pounds and 9 ounces.  Thin meconium stained amniotic fluid.  Intact placenta, three vessel cord.  Normal uterus, fallopian tubes and ovaries bilaterally.  ANESTHESIA:    Spinal INTRAVENOUS FLUIDS:3000 ml ESTIMATED BLOOD LOSS: 1700 ml URINE OUTPUT:  200 ml SPECIMENS: Placenta sent to L&D COMPLICATIONS: None immediate  PROCEDURE IN DETAIL:  The patient received intravenous antibiotics and had sequential compression devices applied to her lower extremities while in the preoperative area.  She was then taken to the operating room where anesthesia was  induced and was found to be adequate. A foley catheter was placed into her bladder and attached to Lauren Porter gravity. She was then placed in a dorsal supine position with a leftward tilt, and prepped and draped in a sterile manner. After an adequate timeout was performed, a Pfannenstiel skin incision was made with scalpel and carried through to the underlying layer of fascia. The fascia was incised in the midline and this incision was extended bilaterally using the Mayo scissors. Kocher clamps were applied to the superior aspect of the fascial incision and the underlying rectus muscles were dissected off bluntly. A similar process was carried out on the inferior aspect of the facial incision. The rectus muscles were separated in the midline bluntly and the peritoneum was entered bluntly. The Alexis self-retaining retractor was introduced into the abdominal cavity. Attention was turned to the lower uterine segment where a bladder flap was created, and a transverse hysterotomy was made with a scalpel and extended bilaterally bluntly. The infant was successfully delivered, and cord was clamped and cut and infant was handed over to awaiting neonatology team. Uterine massage was then administered and the placenta delivered intact with three-vessel cord. The uterus was cleared of clot and debris.  The hysterotomy was closed with 0 Vicryl in a running locked fashion, and an imbricating layer was also placed with a 0 Vicryl. Overall, excellent hemostasis was noted. The pelvis copiously irrigated and cleared of all clot and debris. Hemostasis was confirmed on all surfaces.  The peritoneum and the muscles were reapproximated using 0 vicryl interrupted stitches. The fascia was then closed using 0 Vicryl in a running fashion.  The subcutaneous layer was reapproximated with plain gut and the skin was closed in a subcuticular fashion using 3.0  Vicryl. The patient tolerated the procedure well. Sponge, lap, instrument and needle  counts were correct x 2. She was taken to the recovery room in stable condition.    Lauren Porter,PEGGYMD  08/27/2014 10:47 AM

## 2014-08-27 NOTE — Progress Notes (Signed)
Orthostatic vitals done:  Lying: BP 110/46 HR 55 Sitting: BP 95/67 HR 84  Standing: BP 80/49 HR 79 Pt reports some dizziness with sitting and standing.  LR started.  CNM on call notified and no new orders placed at this time.  Will continue to monitor.

## 2014-08-27 NOTE — Progress Notes (Signed)
Mileydi Milsap Uppal is a 34 y.o. G3P2001 at [redacted]w[redacted]d by ultrasound admitted for induction of labor due to prior IUFD.  Subjective:no pain with contractions   Objective: BP 114/61 mmHg  Pulse 79  Temp(Src) 97.4 F (36.3 C) (Oral)  Resp 16  Ht  (1.651 m)  Wt 104.781 kg (231 lb)  BMI 38.44 kg/m2  LMP 01/17/2014      FHT:  Fetal Heart Rate A      Mode  External filed at 08/26/2014 1902    Baseline Rate (A)  130 bpm filed at 08/27/2014 1610    Variability  6-25 BPM filed at 08/27/2014 0702    Accelerations  15 x 15 filed at 08/27/2014 0702    Decelerations  None filed at 08/27/2014 0702       UC:   regular, every 3 minutes SVE:   Dilation: 4 Effacement (%): 70 Station: Ballotable Exam by:: [employer]: Lab Results  Component Value Date   WBC 8.3 08/26/2014   HGB 11.2* 08/26/2014   HCT 32.8* 08/26/2014   MCV 85.2 08/26/2014   PLT 147* 08/26/2014    Assessment / Plan: Protracted latent phase  Labor: Progressing on Pitocin, will continue to increase then AROM and very slow, head is high Preeclampsia:  no signs or symptoms of toxicity Fetal Wellbeing:  Category I Pain Control:  Fentanyl I/D:  PCN Anticipated MOD:  Starting to consider requesting RCS. IShe has difficulty tolerating SVE. Will consider pain med prior to trying again to ROM and place IUPC  ARNOLD,JAMES 08/27/2014, 7:11 AM

## 2014-08-27 NOTE — Transfer of Care (Signed)
Immediate Anesthesia Transfer of Care Note  Patient: Lauren Porter  Procedure(s) Performed: Procedure(s): CESAREAN SECTION (N/A)  Patient Location: PACU  Anesthesia Type:Spinal  Level of Consciousness: awake, alert  and oriented  Airway & Oxygen Therapy: Patient Spontanous Breathing  Post-op Assessment: Report given to RN and Post -op Vital signs reviewed and stable  Post vital signs: Reviewed and stable  Last Vitals:  Filed Vitals:   08/27/14 0902  BP: 105/61  Pulse: 71  Temp:   Resp: 18    Complications: No apparent anesthesia complications

## 2014-08-27 NOTE — Anesthesia Postprocedure Evaluation (Signed)
  Anesthesia Post-op Note  Patient: Lauren Porter  Procedure(s) Performed: Procedure(s) (LRB): CESAREAN SECTION (N/A)  Patient Location: PACU  Anesthesia Type: Spinal  Level of Consciousness: awake and alert   Airway and Oxygen Therapy: Patient Spontanous Breathing  Post-op Pain: mild  Post-op Assessment: Post-op Vital signs reviewed, Patient's Cardiovascular Status Stable, Respiratory Function Stable, Patent Airway and No signs of Nausea or vomiting  Last Vitals:  Filed Vitals:   08/27/14 0902  BP: 105/61  Pulse: 71  Temp:   Resp: 18    Post-op Vital Signs: stable   Complications: No apparent anesthesia complications

## 2014-08-27 NOTE — Anesthesia Procedure Notes (Signed)
Spinal Patient location during procedure: OR Staffing Anesthesiologist: Juleen Sorrels Performed by: anesthesiologist  Preanesthetic Checklist Completed: patient identified, site marked, surgical consent, pre-op evaluation, timeout performed, IV checked, risks and benefits discussed and monitors and equipment checked Spinal Block Patient position: sitting Prep: Betadine Patient monitoring: heart rate, continuous pulse ox and blood pressure Location: L3-4 Injection technique: single-shot Needle Needle type: Spinocan  Needle gauge: 22 G Needle length: 9 cm Additional Notes Discussed risks and benefits of and differences between spinal and general. Discussed risks of spinal including headache, backache, failure, bleeding, infection, and nerve damage. Patient consents to spinal. Questions answered. Coagulation studies and platelet count acceptable.    

## 2014-08-27 NOTE — Progress Notes (Signed)
Patient ID: Lauren Porter, female   DOB: Apr 30, 1980, 34 y.o.   MRN: [id number] Called to see patient who is now requesting a cesarean section. Patient is concerned regarding her slow progression and desires to proceed with delivery. Risks, benefits and alternatives were explained including but not limited to risks of bleeding, infection and damage to adjacent organs. Patient verbalized understanding and all questions were answered. Consent was signed and the OR notified Fetal status remains reassuring. Baseline 130, mod variability, +accels, no decels

## 2014-08-27 NOTE — Anesthesia Preprocedure Evaluation (Signed)
Anesthesia Evaluation  Patient identified by MRN, date of birth, ID band Patient awake    Reviewed: Allergy & Precautions, H&P , NPO status , Patient's Chart, lab work & pertinent test results  Airway Mallampati: IV  TM Distance: <3 FB Neck ROM: full    Dental no notable dental hx.    Pulmonary neg pulmonary ROS,  breath sounds clear to auscultation  Pulmonary exam normal       Cardiovascular negative cardio ROS Normal cardiovascular examRhythm:regular Rate:Normal     Neuro/Psych negative neurological ROS  negative psych ROS   GI/Hepatic negative GI ROS, Neg liver ROS,   Endo/Other  negative endocrine ROS  Renal/GU negative Renal ROS  negative genitourinary   Musculoskeletal   Abdominal (+) + obese,   Peds  Hematology negative hematology ROS (+)   Anesthesia Other Findings Pregnancy - repeat C/S, first was failure to progress, this is from her desire to have repeat C/S  Platelets and allergies reviewed Denies active cardiac or pulmonary symptoms, METS > 4  Denies blood thinning medications, bleeding disorders, hypertension, asthma, supine hypotension syndrome, previous anesthesia difficulties   Power of interpreter and medical decision making is her husband who is french speaking, patient is from [place] and only speaks that dialect of french   Reproductive/Obstetrics (+) Pregnancy                             Anesthesia Physical Anesthesia Plan  ASA: III  Anesthesia Plan: Spinal   Post-op Pain Management:    Induction:   Airway Management Planned:   Additional Equipment:   Intra-op Plan:   Post-operative Plan:   Informed Consent: I have reviewed the patients History and Physical, chart, labs and discussed the procedure including the risks, benefits and alternatives for the proposed anesthesia with the patient or authorized representative who has indicated his/her  understanding and acceptance.     Plan Discussed with: Anesthesiologist  Anesthesia Plan Comments:         Anesthesia Quick Evaluation

## 2014-08-28 ENCOUNTER — Encounter (HOSPITAL_COMMUNITY): Payer: Self-pay | Admitting: Obstetrics and Gynecology

## 2014-08-28 LAB — CBC
HEMATOCRIT: 26.8 % — AB (ref 36.0–46.0)
HEMOGLOBIN: 9.2 g/dL — AB (ref 12.0–15.0)
MCH: 29.4 pg (ref 26.0–34.0)
MCHC: 34.3 g/dL (ref 30.0–36.0)
MCV: 85.6 fL (ref 78.0–100.0)
PLATELETS: 139 10*3/uL — AB (ref 150–400)
RBC: 3.13 MIL/uL — AB (ref 3.87–5.11)
RDW: 15.7 % — ABNORMAL HIGH (ref 11.5–15.5)
WBC: 8.8 10*3/uL (ref 4.0–10.5)

## 2014-08-28 NOTE — Addendum Note (Signed)
Addendum  created 08/28/14 1610 by Earmon Phoenix, CRNA   Modules edited: Notes Section   Notes Section:  File: [id number]

## 2014-08-28 NOTE — Progress Notes (Signed)
Subjective: Postpartum Day 1: Cesarean Delivery Patient reports incisional pain, tolerating PO, + flatus and no problems voiding.    Objective: Vital signs in last 24 hours: Temp:  [97.7 F (36.5 C)-98.8 F (37.1 C)] 98.2 F (36.8 C) (07/25 0605) Pulse Rate:  [53-84] 63 (07/25 0605) Resp:  [0-20] 18 (07/25 0605) BP: (80-116)/(31-75) 102/54 mmHg (07/25 0605) SpO2:  [96 %-100 %] 100 % (07/25 1610)  Physical Exam:  General: alert, cooperative, appears stated age and no distress Lochia: appropriate Uterine Fundus: firm Incision: healing well, no significant drainage, no dehiscence, no significant erythema DVT Evaluation: No evidence of DVT seen on physical exam. Negative Homan's sign. No cords or calf tenderness.   Recent Labs  08/27/14 1111 08/28/14 0530  HGB 9.7* 9.2*  HCT 28.8* 26.8*    Assessment/Plan: Status post Cesarean section. Doing well postoperatively.  Continue current care.  Wyvonnia Dusky [name] 08/28/2014, 7:15 AM

## 2014-08-28 NOTE — Progress Notes (Signed)
CSW acknowledges consult for insufficient prenatal care.  CSW screening out referral since MOB does not meet criteria for late prenatal care.    [facility] [redacted]w[redacted]d, [facility] at 30 weeks, and attended prenatal appointments.

## 2014-08-28 NOTE — Anesthesia Postprocedure Evaluation (Signed)
  Anesthesia Post-op Note  Patient: Lauren Porter  Procedure(s) Performed: Procedure(s): CESAREAN SECTION (N/A)  Patient Location: Mother/Baby  Anesthesia Type:Spinal  Level of Consciousness: awake, alert , oriented and patient cooperative  Airway and Oxygen Therapy: Patient Spontanous Breathing  Post-op Pain: mild  Post-op Assessment: Patient's Cardiovascular Status Stable, Respiratory Function Stable, No headache, No backache and Patient able to bend at knees    Post-op Vital Signs: Reviewed and stable  Last Vitals:  Filed Vitals:   08/28/14 0605  BP: 102/54  Pulse: 63  Temp: 36.8 C  Resp: 18    Complications: No apparent anesthesia complications

## 2014-08-28 NOTE — Lactation Note (Signed)
This note was copied from the chart of Lauren Alyannah Munguia. Lactation Consultation Note Experienced BF mom BF her other children for 6 months each. Denies any trouble BF. Husband at bedside interpreter for her. He understands great [place]. Mom has large pendulum breast w/everted nipples for a good latch.  Mom slept over time for BF, encouraged to feed on cues or at least every 2-3 hours if baby hasn't cued. Encouraged STS, I&O. Discussed supply and demand.  Referred to Baby and Me Book in Breastfeeding section Pg. 22-23 for position options and Proper latch demonstration.Mom reports + breast changes w/pregnancy. Educated about newborn behavior. Mother informed of post-discharge support and given phone number to the lactation department, including services for phone call assistance; out-patient appointments; and breastfeeding support group. List of other breastfeeding resources in the community given in the handout. Encouraged mother to call for problems or concerns related to breastfeeding. Patient Name: Lauren Porter Date: 08/28/2014 Reason for consult: Initial assessment   Maternal Data Has patient been taught Hand Expression?: Yes Does the patient have breastfeeding experience prior to this delivery?: Yes  Feeding Feeding Type: Breast Fed  LATCH Score/Interventions Latch: Grasps breast easily, tongue down, lips flanged, rhythmical sucking.  Audible Swallowing: A few with stimulation  Type of Nipple: Everted at rest and after stimulation  Comfort (Breast/Nipple): Soft / non-tender     Hold (Positioning): Assistance needed to correctly position infant at breast and maintain latch. Intervention(s): Skin to skin;Position options;Support Pillows;Breastfeeding basics reviewed  LATCH Score: 8  Lactation Tools Discussed/Used     Consult Status Consult Status: PRN Date: 08/30/14 Follow-up type: In-patient    Shahiem Bedwell, Diamond Nickel 08/28/2014, 6:02 AM

## 2014-08-29 MED ORDER — OXYCODONE-ACETAMINOPHEN 5-325 MG PO TABS: 1.0000 | ORAL_TABLET | ORAL | Status: DC | PRN

## 2014-08-29 MED ORDER — OXYCODONE-ACETAMINOPHEN 5-325 MG PO TABS: 1.0000 | ORAL_TABLET | ORAL | Status: AC | PRN

## 2014-08-29 MED ORDER — IBUPROFEN 600 MG PO TABS: 600.0000 mg | ORAL_TABLET | Freq: Four times a day (QID) | ORAL | Status: AC

## 2014-08-29 NOTE — Discharge Summary (Signed)
Obstetric Discharge Summary Reason for Admission: induction of labor Prenatal Procedures: NST Intrapartum Procedures: cesarean: low cervical, transverse Postpartum Procedures: none Complications-Operative and Postpartum: none HEMOGLOBIN  Date Value Ref Range Status  08/28/2014 9.2* 12.0 - 15.0 g/dL Final  81/19/1478 29.5 g/dL Final   HCT  Date Value Ref Range Status  08/28/2014 26.8* 36.0 - 46.0 % Final  05/25/2014 36 % Final  Hospital Course: HPI: Lauren Porter is a 34 y.o. year old G29P2001 female at [redacted]w[redacted]d weeks gestation by 62 week [place] who presents to [facility] for IOL due to Hx IUFD. Prior C/S x 1 for CPD (9-13 baby) and term IUFD for unknown reason, VBAC 7-4 baby. Started PNC at 26 weeks.    Expand All Collapse All   Lauren Porter PROCEDURE DATE: 08/26/2014 - 08/27/2014  PREOPERATIVE DIAGNOSIS: Intrauterine pregnancy at [redacted]w[redacted]d weeks gestation; patient declines vag del attempt  POSTOPERATIVE DIAGNOSIS: The same  PROCEDURE: Cesarean Section  SURGEON: Dr. Catalina Antigua  ASSISTANT: none  INDICATIONS: Lauren Porter is a 34 y.o. G3P2001 at [redacted]w[redacted]d scheduled for cesarean section secondary to patient declines vag del attempt. Patient was being induced at 39 weeks secondary to h/o IUFD. She progressed to 4 cm and a high station. Her labor was augmented with pitocin but patient failed to progress past 4 cm after 20 hours. She opted to proceed with cesarean delivery. The risks of cesarean section discussed with the patient included but were not limited to: bleeding which may require transfusion or reoperation; infection which may require antibiotics; injury to bowel, bladder, ureters or other surrounding organs; injury to the fetus; need for additional procedures including hysterectomy in the event of a life-threatening hemorrhage; placental abnormalities wth subsequent pregnancies, incisional problems, thromboembolic phenomenon and other postoperative/anesthesia  complications. The patient concurred with the proposed plan, giving informed written consent for the procedure.   FINDINGS: Viable female infant in cephalic presentation. Apgars 9 and 9, weight, 7 pounds and 9 ounces. Thin meconium stained amniotic fluid. Intact placenta, three vessel cord. Normal uterus, fallopian tubes and ovaries bilaterally.  ANESTHESIA: Spinal INTRAVENOUS FLUIDS:3000 ml ESTIMATED BLOOD LOSS: 1700 ml URINE OUTPUT: 200 ml SPECIMENS: Placenta sent to L&D COMPLICATIONS: None immediate     Has done well postoperatively. Up and around without any dizziness, though she had some yesterday. Tolerating POs well. Breastfeeding baby.  Desires early discharge later today if baby is allowed to go home.    Physical Exam:  General: alert, cooperative and no distress Lochia: appropriate Uterine Fundus: firm Incision: healing well, no significant drainage, no dehiscence DVT Evaluation: No evidence of DVT seen on physical exam.  Discharge Diagnoses: Term Pregnancy-delivered  Discharge Information: Date: 08/29/2014 Activity: unrestricted and pelvic rest Diet: routine Medications: PNV, Ibuprofen and Percocet Condition: stable and improved Instructions: refer to practice specific booklet Discharge to: home Follow-up Information    Follow up with [facility]. Schedule an appointment as soon as possible for a visit in 4 weeks.   Contact information:   [address] [place] [address] [phone]      Newborn Data: Live born female  Birth Weight: 7 lb 9 oz (3430 g) APGAR: 9, 9  Home with mother   Discharge home early (today) if OK with Peds for baby to be discharged.  [facility] 08/29/2014, 7:34 AM

## 2014-08-29 NOTE — Discharge Instructions (Signed)

## 2014-09-28 ENCOUNTER — Ambulatory Visit (INDEPENDENT_AMBULATORY_CARE_PROVIDER_SITE_OTHER): Payer: Medicaid Other | Admitting: Certified Nurse Midwife

## 2014-09-28 NOTE — Patient Instructions (Signed)
Etonogestrel implant What is this medicine? ETONOGESTREL (et oh noe JES trel) is a contraceptive (birth control) device. It is used to prevent pregnancy. It can be used for up to 3 years. This medicine may be used for other purposes; ask your health care provider or pharmacist if you have questions. COMMON BRAND NAME(S): Implanon, Nexplanon What should I tell my health care provider before I take this medicine? They need to know if you have any of these conditions: -abnormal vaginal bleeding -blood vessel disease or blood clots -cancer of the breast, cervix, or liver -depression -diabetes -gallbladder disease -headaches -heart disease or recent heart attack -high blood pressure -high cholesterol -kidney disease -liver disease -renal disease -seizures -tobacco smoker -an unusual or allergic reaction to etonogestrel, other hormones, anesthetics or antiseptics, medicines, foods, dyes, or preservatives -pregnant or trying to get pregnant -breast-feeding How should I use this medicine? This device is inserted just under the skin on the inner side of your upper arm by a health care professional. Talk to your pediatrician regarding the use of this medicine in children. Special care may be needed. Overdosage: If you think you've taken too much of this medicine contact a poison control center or emergency room at once. Overdosage: If you think you have taken too much of this medicine contact a poison control center or emergency room at once. NOTE: This medicine is only for you. Do not share this medicine with others. What if I miss a dose? This does not apply. What may interact with this medicine? Do not take this medicine with any of the following medications: -amprenavir -bosentan -fosamprenavir This medicine may also interact with the following medications: -barbiturate medicines for inducing sleep or treating seizures -certain medicines for fungal infections like ketoconazole and  itraconazole -griseofulvin -medicines to treat seizures like carbamazepine, felbamate, oxcarbazepine, phenytoin, topiramate -modafinil -phenylbutazone -rifampin -some medicines to treat HIV infection like atazanavir, indinavir, lopinavir, nelfinavir, tipranavir, ritonavir -St. John's wort This list may not describe all possible interactions. Give your health care provider a list of all the medicines, herbs, non-prescription drugs, or dietary supplements you use. Also tell them if you smoke, drink alcohol, or use illegal drugs. Some items may interact with your medicine. What should I watch for while using this medicine? This product does not protect you against HIV infection (AIDS) or other sexually transmitted diseases. You should be able to feel the implant by pressing your fingertips over the skin where it was inserted. Tell your doctor if you cannot feel the implant. What side effects may I notice from receiving this medicine? Side effects that you should report to your doctor or health care professional as soon as possible: -allergic reactions like skin rash, itching or hives, swelling of the face, lips, or tongue -breast lumps -changes in vision -confusion, trouble speaking or understanding -dark urine -depressed mood -general ill feeling or flu-like symptoms -light-colored stools -loss of appetite, nausea -right upper belly pain -severe headaches -severe pain, swelling, or tenderness in the abdomen -shortness of breath, chest pain, swelling in a leg -signs of pregnancy -sudden numbness or weakness of the face, arm or leg -trouble walking, dizziness, loss of balance or coordination -unusual vaginal bleeding, discharge -unusually weak or tired -yellowing of the eyes or skin Side effects that usually do not require medical attention (Report these to your doctor or health care professional if they continue or are bothersome.): -acne -breast pain -changes in  weight -cough -fever or chills -headache -irregular menstrual bleeding -itching, burning, and   vaginal discharge -pain or difficulty passing urine -sore throat This list may not describe all possible side effects. Call your doctor for medical advice about side effects. You may report side effects to FDA at 1-800-FDA-1088. Where should I keep my medicine? This drug is given in a hospital or clinic and will not be stored at home. NOTE: This sheet is a summary. It may not cover all possible information. If you have questions about this medicine, talk to your doctor, pharmacist, or health care provider.  2015, Elsevier/Gold Standard. (2011-07-28 15:37:45)  

## 2014-09-28 NOTE — Progress Notes (Signed)
Subjective:     Lauren Porter is a 34 y.o. female who presents for a postpartum visit. She is 4 weeks postpartum following a low cervical vertical Cesarean section. I have fully reviewed the prenatal and intrapartum course. The delivery was at 39+1 gestational weeks. Outcome: repeat cesarean section, low transverse incision. Anesthesia: spinal. Postpartum course has been  . Baby's course has been uneventful. Baby is feeding by breast. Bleeding no bleeding. Bowel function is normal. Bladder function is normal. Patient is not sexually active. Contraception method is condoms. We did discuss Nexplanon and she states she will call if she decides she wants one. Postpartum depression screening: negative.  Patient Name Sex DOB SSN    Lauren Porter, Lauren Porter Female 10/04/1980 [id number]    Op Note by Catalina Antigua, MD at 08/27/2014 10:47 AM    Author: Catalina Antigua, MD Service: Obstetrics/Gynecology Author Type: Physician   Filed: 08/27/2014 10:47 AM Note Time: 08/27/2014 10:47 AM Status: Signed   Editor: Catalina Antigua, MD (Physician)     Expand All Collapse All   Lauren Porter PROCEDURE DATE: 08/26/2014 - 08/27/2014  PREOPERATIVE DIAGNOSIS: Intrauterine pregnancy at [redacted]w[redacted]d weeks gestation; patient declines vag del attempt  POSTOPERATIVE DIAGNOSIS: The same  PROCEDURE: Cesarean Section  SURGEON: Dr. Catalina Antigua  ASSISTANT: none  INDICATIONS: Lauren Porter is a 34 y.o. G3P2001 at [redacted]w[redacted]d scheduled for cesarean section secondary to patient declines vag del attempt. Patient was being induced at 39 weeks secondary to h/o IUFD. She progressed to 4 cm and a high station. Her labor was augmented with pitocin but patient failed to progress past 4 cm after 20 hours. She opted to proceed with cesarean delivery. The risks of cesarean section discussed with the patient included but were not limited to: bleeding which may require transfusion or reoperation; infection which may require antibiotics;  injury to bowel, bladder, ureters or other surrounding organs; injury to the fetus; need for additional procedures including hysterectomy in the event of a life-threatening hemorrhage; placental abnormalities wth subsequent pregnancies, incisional problems, thromboembolic phenomenon and other postoperative/anesthesia complications. The patient concurred with the proposed plan, giving informed written consent for the procedure.   FINDINGS: Viable female infant in cephalic presentation. Apgars 9 and 9, weight, 7 pounds and 9 ounces. Thin meconium stained amniotic fluid. Intact placenta, three vessel cord. Normal uterus, fallopian tubes and ovaries bilaterally.  ANESTHESIA: Spinal INTRAVENOUS FLUIDS:3000 ml ESTIMATED BLOOD LOSS: 1700 ml URINE OUTPUT: 200 ml SPECIMENS: Placenta sent to L&D COMPLICATIONS: None immediate  PROCEDURE IN DETAIL: The patient received intravenous antibiotics and had sequential compression devices applied to her lower extremities while in the preoperative area. She was then taken to the operating room where anesthesia was induced and was found to be adequate. A foley catheter was placed into her bladder and attached to constant gravity. She was then placed in a dorsal supine position with a leftward tilt, and prepped and draped in a sterile manner. After an adequate timeout was performed, a Pfannenstiel skin incision was made with scalpel and carried through to the underlying layer of fascia. The fascia was incised in the midline and this incision was extended bilaterally using the Mayo scissors. Kocher clamps were applied to the superior aspect of the fascial incision and the underlying rectus muscles were dissected off bluntly. A similar process was carried out on the inferior aspect of the facial incision. The rectus muscles were separated in the midline bluntly and the peritoneum was entered bluntly. The Alexis self-retaining retractor was introduced  into the  abdominal cavity. Attention was turned to the lower uterine segment where a bladder flap was created, and a transverse hysterotomy was made with a scalpel and extended bilaterally bluntly. The infant was successfully delivered, and cord was clamped and cut and infant was handed over to awaiting neonatology team. Uterine massage was then administered and the placenta delivered intact with three-vessel cord. The uterus was cleared of clot and debris. The hysterotomy was closed with 0 Vicryl in a running locked fashion, and an imbricating layer was also placed with a 0 Vicryl. Overall, excellent hemostasis was noted. The pelvis copiously irrigated and cleared of all clot and debris. Hemostasis was confirmed on all surfaces. The peritoneum and the muscles were reapproximated using 0 vicryl interrupted stitches. The fascia was then closed using 0 Vicryl in a running fashion. The subcutaneous layer was reapproximated with plain gut and the skin was closed in a subcuticular fashion using 3.0 Vicryl. The patient tolerated the procedure well. Sponge, lap, instrument and needle counts were correct x 2. She was taken to the recovery room in stable condition.    CONSTANT,PEGGYMD  08/27/2014 10:47 AM            Review of Systems Pertinent items are noted in HPI.   Objective:    BP 122/71 mmHg  Pulse 66  Temp(Src) 98.5 F (36.9 C)  Wt 212 lb (96.163 kg)  General:  alert and cooperative   Breasts:    Lungs: clear to auscultation bilaterally  Heart:  normal apical impulse  Abdomen: soft, non-tender; bowel sounds normal; no masses,  no organomegaly   Vulva:  not evaluated  Vagina: not evaluated  Cervix:    Corpus: not examined  Adnexa:  not evaluated  Rectal Exam: Not performed.        Assessment:     4 week postpartum exam. Pap smear not done at today's visit.   Plan:    1. Contraception: condoms 2. Possible Nexplanon insertion 3. Return for yearly exam and prn 3. Follow up in: 1 year  or as needed.
